# Patient Record
Sex: Male | Born: 1970 | Race: Black or African American | Hispanic: No | Marital: Single | Smoking: Former smoker
Health system: Southern US, Community
[De-identification: ages and names within clinical notes are randomized; demographics above are authoritative.]

## PROBLEM LIST (undated history)

## (undated) ENCOUNTER — Emergency Department (HOSPITAL_COMMUNITY): Admission: EM | Payer: Self-pay | Source: Home / Self Care

## (undated) HISTORY — PX: HERNIA REPAIR: SHX51

---

## 2001-05-14 ENCOUNTER — Ambulatory Visit (HOSPITAL_COMMUNITY): Admission: RE | Admit: 2001-05-14 | Discharge: 2001-05-14 | Payer: Self-pay | Admitting: Surgery

## 2001-05-26 ENCOUNTER — Inpatient Hospital Stay (HOSPITAL_COMMUNITY): Admission: AC | Admit: 2001-05-26 | Discharge: 2001-05-27 | Payer: Self-pay | Admitting: *Deleted

## 2001-06-12 ENCOUNTER — Encounter: Admission: RE | Admit: 2001-06-12 | Discharge: 2001-07-26 | Payer: Self-pay | Admitting: General Surgery

## 2001-07-25 ENCOUNTER — Emergency Department (HOSPITAL_COMMUNITY): Admission: EM | Admit: 2001-07-25 | Discharge: 2001-07-25 | Payer: Self-pay | Admitting: Emergency Medicine

## 2001-11-05 ENCOUNTER — Encounter: Admission: RE | Admit: 2001-11-05 | Discharge: 2001-11-05 | Payer: Self-pay | Admitting: Internal Medicine

## 2001-11-05 ENCOUNTER — Encounter: Payer: Self-pay | Admitting: Internal Medicine

## 2003-10-14 ENCOUNTER — Emergency Department (HOSPITAL_COMMUNITY): Admission: EM | Admit: 2003-10-14 | Discharge: 2003-10-14 | Payer: Self-pay | Admitting: Emergency Medicine

## 2005-07-18 ENCOUNTER — Encounter: Admission: RE | Admit: 2005-07-18 | Discharge: 2005-07-18 | Payer: Self-pay | Admitting: Internal Medicine

## 2006-05-11 ENCOUNTER — Ambulatory Visit (HOSPITAL_BASED_OUTPATIENT_CLINIC_OR_DEPARTMENT_OTHER): Admission: RE | Admit: 2006-05-11 | Discharge: 2006-05-11 | Payer: Self-pay | Admitting: Internal Medicine

## 2006-05-13 ENCOUNTER — Ambulatory Visit: Payer: Self-pay | Admitting: Internal Medicine

## 2008-11-05 ENCOUNTER — Encounter: Admission: RE | Admit: 2008-11-05 | Discharge: 2008-11-05 | Payer: Self-pay | Admitting: General Practice

## 2008-11-12 ENCOUNTER — Encounter: Admission: RE | Admit: 2008-11-12 | Discharge: 2008-12-15 | Payer: Self-pay | Admitting: General Practice

## 2010-06-18 ENCOUNTER — Encounter: Admission: RE | Admit: 2010-06-18 | Discharge: 2010-06-18 | Payer: Self-pay | Admitting: Occupational Medicine

## 2011-01-21 NOTE — Procedures (Signed)
NAME:  Geoffrey, Henderson                ACCOUNT NO.:  [id number]   MEDICAL RECORD NO.:  [id number]          PATIENT TYPE:  OUT   LOCATION:  SLEEP CENTER                 FACILITY:  [facility]   PHYSICIAN:  Clinton D. Maple Hudson, MD, FCCP, FACPDATE OF BIRTH:  1971/07/28   DATE OF STUDY:                              NOCTURNAL POLYSOMNOGRAM   REFERRING PHYSICIAN:  Fleet Contras, M.D.   DATE OF STUDY:  May 11, 2006.   INDICATIONS FOR PROCEDURE:  Hypersomnia with sleep apnea.   EPWORTH SLEEPINESS SCORE:  16/24, BMI 30.  Weight 192.   No medication. NPSG protocol requested.   SLEEP ARCHITECTURE:  Total sleep time 417 minutes with sleep efficiency 93%.  Stage I was 3%, stage II 75%, stages III and IV absent, REM 22% of total  sleep time.  Sleep latency 25 minutes, REM latency 43 minutes, awake after  sleep onset 7 minutes, arousal index 10.8.  No bedtime medication taken.   RESPIRATORY DATA:  Apnea/hypopnea index (AHI, RDI) 9.3 obstructive events  per hour indicating mild obstructive sleep apnea/hypopnea syndrome.  There  were 40 obstructive apneas and 25 hypopneas.  Most events occurred while  supine, although some were noted in all sleep positions.  REM AHI 15 per  hour.   OXYGEN DATA:  Moderate to severe snoring with oxygen desaturation to a nadir  of 80%.  Mean oxygen saturation through the study was 97% on room air.   CARDIAC DATA:  Sinus rhythm 48-50 beats per minute.   MOVEMENTS/PARASOMNIA:  Occasional limb jerk with arousal, 4 per hour which  is mildly elevated but of uncertain significance.  No bathroom trips.   IMPRESSION/RECOMMENDATIONS:  1. Mild obstructive sleep apnea/hypopnea syndrome, AHI 9.3 per hour with      most events while supine.  Moderate to loud snoring with oxygen      desaturation to a nadir of 80%.  2. Scores in this range are lower than usually treated with CPAP unless      symptomatic and other measures are unsuccessful.  It may be sufficient      for him to  sleep off the flat of his back and to treat nasal congestion      if      appropriate.  3. Mild periodic limb movement syndrome with arousal, 4 per hour.      Clinton D. Maple Hudson, MD, [facility], FACP  Diplomate, Biomedical engineer of Sleep Medicine  Electronically Signed     CDY/MEDQ  D:  05/13/2006 12:43:43  T:  05/14/2006 19:38:08  Job:  [id number]

## 2011-01-21 NOTE — Op Note (Signed)
[place]  Patient:    Geoffrey Henderson, CITRO Visit Number: [id number] MRN: [id number]          Service Type: DSU Location: DAY Attending Physician:  Andre Lefort Dictated by:   Sandria Bales. Ezzard Standing, M.D. Proc. Date: 05/14/01 Admit Date:  05/14/2001   CC:         Kern Reap, M.D.   Operative Report  DATE OF BIRTH:  1970-09-14  PREOPERATIVE DIAGNOSIS:  Umbilical hernia.  POSTOPERATIVE DIAGNOSIS:  Umbilical hernia.  OPERATION PERFORMED:  Umbilical herniorrhaphy.  SURGEON:  Sandria Bales. Ezzard Standing, M.D.  ASSISTANT:  None.  ANESTHESIA:  General LMA.  COMPLICATIONS:  None.  INDICATIONS FOR PROCEDURE:  The patient is a 40 year old black male from [place] with a symptomatic umbilical hernia and comes for repair of this hernia.  DESCRIPTION OF PROCEDURE:  The patient was placed in a supine position and given a general endotracheal anesthetic with LMA anesthesia.  His abdomen was prepped with Betadine solution and sterilely draped.  An infraumbilical smiling incision was made with sharp dissection finding a fascial defect underneath the umbilicus which was about 1.5 cm in diameter.  The hernia sac was dissected off the umbilical skin. The hernia sac was then resected.  The preperitoneal fat was tucked back under the fascia.  I then closed the umbilical hernia defect using five interrupted #1 Novofil sutures.  The wound was then irrigated and subcutaneous tissues closed with 3-0 Vicryl suture.  The skin was closed with a 3-0 Vicryl suture and the subcuticular stitch was a 5-0 Vicryl suture.  The wound was then painted with benzoin and steri-stripped and sterilely dressed.  The patient tolerated the procedure well and was transported to the recovery room in good condition.  The sponge and needle counts were correct at the end of the case. Dictated by:   Sandria Bales. Ezzard Standing, M.D. Attending Physician:  Andre Lefort DD:   05/14/01 TD:  05/14/01 Job: [id number]

## 2012-09-17 ENCOUNTER — Encounter (HOSPITAL_COMMUNITY): Payer: Self-pay | Admitting: *Deleted

## 2012-09-17 ENCOUNTER — Emergency Department (INDEPENDENT_AMBULATORY_CARE_PROVIDER_SITE_OTHER)
Admission: EM | Admit: 2012-09-17 | Discharge: 2012-09-17 | Disposition: A | Discharge status: Home / Self Care | Payer: Self-pay | Source: Home / Self Care | Attending: Family Medicine | Admitting: Family Medicine

## 2012-09-17 DIAGNOSIS — R109 Unspecified abdominal pain: Secondary | ICD-10-CM

## 2012-09-17 DIAGNOSIS — R51 Headache: Secondary | ICD-10-CM

## 2012-09-17 DIAGNOSIS — Z041 Encounter for examination and observation following transport accident: Secondary | ICD-10-CM

## 2012-09-17 DIAGNOSIS — M542 Cervicalgia: Secondary | ICD-10-CM

## 2012-09-17 MED ORDER — CYCLOBENZAPRINE HCL 5 MG PO TABS: 5.0000 mg | ORAL_TABLET | Freq: Three times a day (TID) | ORAL | Status: DC | PRN
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
## 2014-01-15 ENCOUNTER — Encounter (HOSPITAL_COMMUNITY): Payer: Self-pay | Admitting: Emergency Medicine

## 2014-01-15 ENCOUNTER — Emergency Department (HOSPITAL_COMMUNITY)
Admission: EM | Admit: 2014-01-15 | Discharge: 2014-01-15 | Disposition: A | Discharge status: Home / Self Care | Payer: BC Managed Care – PPO | Attending: Emergency Medicine | Admitting: Emergency Medicine

## 2014-01-15 DIAGNOSIS — W261XXA Contact with sword or dagger, initial encounter: Secondary | ICD-10-CM

## 2014-01-15 DIAGNOSIS — IMO0002 Reserved for concepts with insufficient information to code with codable children: Secondary | ICD-10-CM | POA: Insufficient documentation

## 2014-01-15 DIAGNOSIS — Z79899 Other long term (current) drug therapy: Secondary | ICD-10-CM | POA: Insufficient documentation

## 2014-01-15 DIAGNOSIS — Z23 Encounter for immunization: Secondary | ICD-10-CM | POA: Insufficient documentation

## 2014-01-15 DIAGNOSIS — Y93G9 Activity, other involving cooking and grilling: Secondary | ICD-10-CM | POA: Insufficient documentation

## 2014-01-15 DIAGNOSIS — S61209A Unspecified open wound of unspecified finger without damage to nail, initial encounter: Secondary | ICD-10-CM | POA: Insufficient documentation

## 2014-01-15 DIAGNOSIS — Z87891 Personal history of nicotine dependence: Secondary | ICD-10-CM | POA: Insufficient documentation

## 2014-01-15 DIAGNOSIS — Z7982 Long term (current) use of aspirin: Secondary | ICD-10-CM | POA: Insufficient documentation

## 2014-01-15 DIAGNOSIS — W260XXA Contact with knife, initial encounter: Secondary | ICD-10-CM | POA: Insufficient documentation

## 2014-01-15 DIAGNOSIS — Y9289 Other specified places as the place of occurrence of the external cause: Secondary | ICD-10-CM | POA: Insufficient documentation

## 2014-01-15 DIAGNOSIS — S61211A Laceration without foreign body of left index finger without damage to nail, initial encounter: Secondary | ICD-10-CM

## 2014-01-15 MED ORDER — TRAMADOL HCL 50 MG PO TABS: 50.0000 mg | ORAL_TABLET | Freq: Four times a day (QID) | ORAL | Status: DC | PRN

## 2014-01-15 MED ORDER — TETANUS-DIPHTH-ACELL PERTUSSIS 5-2.5-18.5 LF-MCG/0.5 IM SUSP
0.5000 mL | Freq: Once | INTRAMUSCULAR | Status: AC
  Administered 2014-01-15: 0.5 mL via INTRAMUSCULAR
  Filled 2014-01-15: qty 0.5

## 2014-01-15 NOTE — ED Notes (Signed)
Pt presents to department for evaluation of laceration to 2nd L finger. States he accidentally cut finger with knife last night, was seen at PCP today and told to come to ED for continued bleeding. Sensation and movement intact. Tetanus unknown.

## 2014-01-15 NOTE — Discharge Instructions (Signed)
Keep wound clean and covered. Follow up with your doctor as needed.

## 2014-01-15 NOTE — ED Provider Notes (Signed)
CSN: [id number]     Arrival date & time 01/15/14  1205 History  This chart was scribed for non-physician practitioner working with Rolland PorterMark James, MD, by Jarvis Morganaylor Ferguson, ED Scribe. This patient was seen in room TR11C/TR11C and the patient's care was started at 12:30 PM.     Chief Complaint  Patient presents with  . Laceration     HPI HPI Comments: Geoffrey Henderson is a 43 y.o. male who presents to the Emergency Department complaining of a laceration to 2nd left finger. Patient states that he accidentally cut his finger with a knife last night when he was cutting chicken. Patient states that he went to see his PCP today and that they sent him to the ED because the bleeding could not be controlled. Bleeding is attempted to be controlled with gauze. Patient states that he is able to move his finger. He states he is unsure of when his last T-dap vaccination was. Patient denies any numbness in finger.   History reviewed. No pertinent past medical history. Past Surgical History  Procedure Laterality Date  . Hernia repair     History reviewed. No pertinent family history. History  Substance Use Topics  . Smoking status: Former Smoker    Quit date: 06/06/2011  . Smokeless tobacco: Not on file  . Alcohol Use: Yes     Comment: occasional    Review of Systems  Skin: Positive for wound (laceration to 2nd finger on left hand).  Neurological: Negative for numbness (left 2nd finger).      Allergies  Review of patient's allergies indicates no known allergies.  Home Medications   Prior to Admission medications   Medication Sig Start Date End Date Taking? Authorizing Provider  aspirin EC 81 MG tablet Take 81 mg by mouth daily.   Yes Historical Provider, MD  fexofenadine (ALLEGRA) 180 MG tablet Take 180 mg by mouth daily.   Yes Historical Provider, MD  fluticasone (FLONASE) 50 MCG/ACT nasal spray Place 2 sprays into both nostrils daily as needed. For allergies 12/29/13  Yes Historical Provider,  MD  Multiple Vitamin (MULTIVITAMIN WITH MINERALS) TABS tablet Take 1 tablet by mouth daily.   Yes Historical Provider, MD  NEXIUM 40 MG capsule Take 1 capsule by mouth daily. 12/29/13  Yes Historical Provider, MD   Triage Vitals: BP 118/70  Pulse 104  Temp(Src) 98.2 F (36.8 C) (Oral)  Resp 18  SpO2 100% Physical Exam  Nursing note and vitals reviewed. Constitutional: He is oriented to person, place, and time. He appears well-developed and well-nourished. No distress.  HENT:  Head: Normocephalic and atraumatic.  Eyes: Conjunctivae are normal.  Neck: Normal range of motion.  Cardiovascular: Normal rate and regular rhythm.  Exam reveals no gallop and no friction rub.   No murmur heard. Pulmonary/Chest: Effort normal and breath sounds normal. He has no wheezes. He has no rales. He exhibits no tenderness.  Musculoskeletal: Normal range of motion.  Lymphadenopathy:    He has no cervical adenopathy.  Neurological: He is alert and oriented to person, place, and time. Coordination normal.  Left distal index finger sensation intact. Speech is goal-oriented. Moves limbs without ataxia.   Skin: Skin is warm and dry.  Laceration to distal left index finger that is continuing to ooze blood. The wound is open and does not have approximated edges.   Psychiatric: He has a normal mood and affect. His behavior is normal.    ED Course  NERVE BLOCK Date/Time: 01/15/2014 1:55 PM Performed by:  Isaih Bulger Authorized by: Emilia BeckSZEKALSKI, Dearia [place] Consent: Verbal consent obtained. Consent given by: patient Patient understanding: patient states understanding of the procedure being performed Patient consent: the patient's understanding of the procedure matches consent given Patient identity confirmed: verbally with patient Indications: pain relief Body area: upper extremity Nerve: digital Laterality: left Patient sedated: no Patient position: sitting Needle gauge: 22 G Location technique:  anatomical landmarks Local anesthetic: lidocaine 2% without epinephrine Anesthetic total: 5 ml Outcome: pain improved Patient tolerance: Patient tolerated the procedure well with no immediate complications.   (including critical care time)  LACERATION REPAIR Performed by: Emilia BeckKaitlyn Zelta Enfield Authorized by: Emilia BeckKaitlyn Constantino Starace Consent: Verbal consent obtained. Risks and benefits: risks, benefits and alternatives were discussed Consent given by: patient Patient identity confirmed: provided demographic data Prepped and Draped in normal sterile fashion Wound explored  Laceration Location: left distal index finger  Laceration Length: 1 cm  No Foreign Bodies seen or palpated  Anesthesia: digital block  Local anesthetic: lidocaine 2% without epinephrine  Anesthetic total: 5 ml  Irrigation method: syringe Amount of cleaning: standard  Skin closure: surgicel   Number of sutures: n/a  Technique: n/a  Patient tolerance: Patient tolerated the procedure well with no immediate complications.   DIAGNOSTIC STUDIES: Oxygen Saturation is 100% on RA, normal by my interpretation.    COORDINATION OF CARE:    Labs Review Labs Reviewed - No data to display  Imaging Review No results found.   EKG Interpretation None      MDM   Final diagnoses:  Laceration of left index finger w/o foreign body w/o damage to nail    3:47 PM Patient's wound cleaned and repaired with surgicel with overlying gauze and coban. Patient instructed to keep wound dressed until it heals. Patient given tdap.   I personally performed the services described in this documentation, which was scribed in my presence. The recorded information has been reviewed and is accurate.     Emilia BeckKaitlyn Errica Dutil, PA-C 01/15/14 1550

## 2014-01-15 NOTE — ED Notes (Signed)
Bandage applied to left pointer finger by PA

## 2014-01-20 NOTE — ED Provider Notes (Signed)
Medical screening examination/treatment/procedure(s) were performed by non-physician practitioner and as supervising physician I was immediately available for consultation/collaboration.   EKG Interpretation None        Alayne Estrella, MD 01/20/14 2138 

## 2014-11-13 ENCOUNTER — Ambulatory Visit (INDEPENDENT_AMBULATORY_CARE_PROVIDER_SITE_OTHER): Payer: BLUE CROSS/BLUE SHIELD | Admitting: Podiatry

## 2014-11-13 ENCOUNTER — Encounter: Payer: Self-pay | Admitting: Podiatry

## 2014-11-13 VITALS — BP 123/44 | HR 71 | Resp 16

## 2014-11-13 DIAGNOSIS — L600 Ingrowing nail: Secondary | ICD-10-CM

## 2014-11-13 NOTE — Patient Instructions (Signed)

## 2014-11-13 NOTE — Progress Notes (Signed)
   Subjective:    Patient ID: Geoffrey Henderson, male    DOB: 07/03/1971, 44 y.o.   MRN: [id number]  HPI  Pt presents with right 4th ingrown nail lateral border and left great ingrown nail medial border, both painful and red  Review of Systems  Allergic/Immunologic: Positive for environmental allergies.  All other systems reviewed and are negative.      Objective:   Physical Exam        Assessment & Plan:

## 2014-11-14 NOTE — Progress Notes (Signed)
Subjective:     Patient ID: Geoffrey Henderson, male   DOB: 02/01/1971, 44 y.o.   MRN: [id number]  HPI patient presents with 2 ingrown toenails right fourth and left big toe stating that they're tender and they get red and it's hard for her to cut them   Review of Systems  All other systems reviewed and are negative.      Objective:   Physical Exam  Constitutional: He is oriented to person, place, and time.  Cardiovascular: Intact distal pulses.   Musculoskeletal: Normal range of motion.  Neurological: He is oriented to person, place, and time.  Skin: Skin is warm. No erythema.  Nursing note and vitals reviewed.  neurovascular status is found to be intact with muscle strength adequate and range of motion within normal limits. Right fourth toe medial border is tender when pressed and left big toe medial border is incurvated and sore when pressed. She's tried trimming and soak them without relief of symptoms and they're becoming increasingly difficult to wear shoe gear with     Assessment:     Ingrown toenail deformity right fourth toe left hallux with pain    Plan:     H&P and condition discussed. I've recommended removal of the corners and I explained procedure and risk area patient wants surgery and today I infiltrated each toe with 60 mg Xylocaine Marcaine mixture remove the border of the right fourth toe and the left hallux exposed matrix and applied phenol 3 applications 30 seconds followed by alcohol lavage and sterile dressing. Gave instructions on soaks and reappoint

## 2014-11-17 ENCOUNTER — Encounter: Payer: Self-pay | Admitting: Podiatry

## 2014-11-17 ENCOUNTER — Ambulatory Visit (INDEPENDENT_AMBULATORY_CARE_PROVIDER_SITE_OTHER): Payer: BLUE CROSS/BLUE SHIELD | Admitting: Podiatry

## 2014-11-17 ENCOUNTER — Ambulatory Visit: Payer: BLUE CROSS/BLUE SHIELD | Admitting: Podiatry

## 2014-11-17 VITALS — BP 111/65 | HR 69 | Temp 98.6°F

## 2014-11-17 DIAGNOSIS — L600 Ingrowing nail: Secondary | ICD-10-CM

## 2014-11-17 DIAGNOSIS — IMO0002 Reserved for concepts with insufficient information to code with codable children: Secondary | ICD-10-CM

## 2014-11-17 DIAGNOSIS — L03039 Cellulitis of unspecified toe: Secondary | ICD-10-CM

## 2014-11-17 MED ORDER — HYDROCODONE-ACETAMINOPHEN 10-325 MG PO TABS: 1.0000 | ORAL_TABLET | Freq: Three times a day (TID) | ORAL | Status: DC | PRN

## 2014-11-17 MED ORDER — CEPHALEXIN 500 MG PO CAPS: 500.0000 mg | ORAL_CAPSULE | Freq: Two times a day (BID) | ORAL | Status: DC

## 2014-11-17 NOTE — Progress Notes (Signed)
   Subjective:    Patient ID: Geoffrey Henderson, male    DOB: 10/19/1970, 44 y.o.   MRN: [id number]  HPI  f/u on Left great toe and right 4th toe ingrown nail procedures.  Patient complaining of swelling and pain.  Said he took tylenol, but that didn't work so isn't taking anything.  Review of Systems  All other systems reviewed and are negative.      Objective:   Physical Exam        Assessment & Plan:

## 2014-11-18 NOTE — Progress Notes (Signed)
Subjective:     Patient ID: Geoffrey Henderson, male   DOB: 03/02/1971, 44 y.o.   MRN: [id number]  HPI patient states I'm still having some pain and more my ingrown and were fixed and I wanted to make sure everything was okay   Review of Systems     Objective:   Physical Exam Neurovascular status intact no change in health history with draining left hallux medial border and fourth toe right foot medial border. I did not note proximal edema erythema or drainage noted    Assessment:     Possible mild paronychia infection of healing nail bed    Plan:     As precautionary measure I did place on cephalexin 500 mg twice a day and also Vicodin 06/07/2024 for pain. I instructed if any issues were to occur patient's to let us know immediately if not this should go on to uneventful healing

## 2014-11-20 ENCOUNTER — Encounter: Payer: Self-pay | Admitting: Podiatry

## 2014-11-25 ENCOUNTER — Ambulatory Visit (INDEPENDENT_AMBULATORY_CARE_PROVIDER_SITE_OTHER): Payer: BLUE CROSS/BLUE SHIELD

## 2014-11-25 ENCOUNTER — Encounter: Payer: Self-pay | Admitting: Podiatry

## 2014-11-25 ENCOUNTER — Ambulatory Visit (INDEPENDENT_AMBULATORY_CARE_PROVIDER_SITE_OTHER): Payer: BLUE CROSS/BLUE SHIELD | Admitting: Podiatry

## 2014-11-25 VITALS — BP 116/63 | HR 84 | Resp 18

## 2014-11-25 DIAGNOSIS — R52 Pain, unspecified: Secondary | ICD-10-CM

## 2014-11-25 DIAGNOSIS — L03019 Cellulitis of unspecified finger: Secondary | ICD-10-CM

## 2014-11-25 DIAGNOSIS — IMO0002 Reserved for concepts with insufficient information to code with codable children: Secondary | ICD-10-CM

## 2014-11-26 NOTE — Progress Notes (Signed)
Subjective:     Patient ID: Geoffrey Henderson, male   DOB: 11/17/1970, 44 y.o.   MRN: [id number]  HPI patient concerns his right fourth toe that he states is discolored and had an ingrown toenail remove several weeks ago. He is concerned that there could be a problem with circulation   Review of Systems     Objective:   Physical Exam I checked circulatory status thoroughly and found pulses to be excellent and all digits to be perfused well with all toes warm on the right foot. I noted that the fourth toe right does have some slight dusky disc coloration but there is minimal pain associated with it and the ingrown toenail remove is healing well with no drainage noted currently    Assessment:     Possible stress to the right fourth toe but no indication of acute gangrenous process that's going on due to no discomfort and excellent circulatory status with a warm toe noted    Plan:     I instructed on warm water soaks and reviewed an x-ray of this foot and explained if it should change anyway he is to reappoint immediately if not I want to recheck again in 3-4 weeks and it should gradually return to normal color but may take time.

## 2014-12-10 ENCOUNTER — Telehealth: Payer: Self-pay | Admitting: *Deleted

## 2014-12-10 NOTE — Telephone Encounter (Signed)
Pt states the left big toe did good after the surgical procedure, the right toe next to the pinkie toe is still painful and dripping.  Pt requested an appt with Dr. Charlsie Merlesegal.  I encouraged pt to continue the soaks and transferred to schedulers.

## 2014-12-11 ENCOUNTER — Ambulatory Visit (INDEPENDENT_AMBULATORY_CARE_PROVIDER_SITE_OTHER): Payer: BLUE CROSS/BLUE SHIELD | Admitting: Podiatry

## 2014-12-11 ENCOUNTER — Encounter: Payer: Self-pay | Admitting: Podiatry

## 2014-12-11 VITALS — BP 115/78 | HR 75 | Resp 15

## 2014-12-11 DIAGNOSIS — R52 Pain, unspecified: Secondary | ICD-10-CM

## 2014-12-11 DIAGNOSIS — IMO0002 Reserved for concepts with insufficient information to code with codable children: Secondary | ICD-10-CM

## 2014-12-11 DIAGNOSIS — L03019 Cellulitis of unspecified finger: Secondary | ICD-10-CM

## 2014-12-14 NOTE — Progress Notes (Signed)
Subjective:     Patient ID: Geoffrey Henderson, male   DOB: 05/28/1971, 44 y.o.   MRN: [id number]  HPI patient states I want you to check my fourth toe again. It is not hurting but I was concerned about the color of it   Review of Systems     Objective:   Physical Exam Neurovascular status was found to be intact with digits well perfused and I noted warmth to the fourth toe right. There is a sloughing of the skin layer with underlying healthy tissue and a small amount of distal irritation to the toe that's localized with no drainage no odor or no proximal erythema edema noted. There is no discomfort when I palpated the toe    Assessment:     Probable mild vascular trauma to the right fourth toe which appears to be healing but we'll have to go through a period of discoloration before normal tissue will gradually recur. No indications of necrosis currently or any long-term pathology    Plan:     I reviewed the color of the toe with the patient and advised on continued soaks and monitoring of this. I do think it will heal uneventfully did explain there is a possibility for microvascular disease and there is a possibility for sloughing of tissue to occur. Do not see current indications of gangrene but we will have to continue to monitor and I will see back again in 2 weeks or earlier if any adverse changes or pain should occur

## 2014-12-16 ENCOUNTER — Ambulatory Visit: Payer: BLUE CROSS/BLUE SHIELD | Admitting: Podiatry

## 2017-08-06 ENCOUNTER — Emergency Department (HOSPITAL_COMMUNITY): Payer: Managed Care, Other (non HMO)

## 2017-08-06 ENCOUNTER — Emergency Department (HOSPITAL_COMMUNITY)
Admission: EM | Admit: 2017-08-06 | Discharge: 2017-08-06 | Disposition: A | Discharge status: Home / Self Care | Payer: Managed Care, Other (non HMO) | Attending: Emergency Medicine | Admitting: Emergency Medicine

## 2017-08-06 ENCOUNTER — Encounter (HOSPITAL_COMMUNITY): Payer: Self-pay | Admitting: *Deleted

## 2017-08-06 DIAGNOSIS — S161XXA Strain of muscle, fascia and tendon at neck level, initial encounter: Secondary | ICD-10-CM | POA: Diagnosis not present

## 2017-08-06 DIAGNOSIS — M62830 Muscle spasm of back: Secondary | ICD-10-CM | POA: Diagnosis not present

## 2017-08-06 DIAGNOSIS — Z79899 Other long term (current) drug therapy: Secondary | ICD-10-CM | POA: Insufficient documentation

## 2017-08-06 DIAGNOSIS — Y999 Unspecified external cause status: Secondary | ICD-10-CM | POA: Insufficient documentation

## 2017-08-06 DIAGNOSIS — Z87891 Personal history of nicotine dependence: Secondary | ICD-10-CM | POA: Insufficient documentation

## 2017-08-06 DIAGNOSIS — Z7982 Long term (current) use of aspirin: Secondary | ICD-10-CM | POA: Diagnosis not present

## 2017-08-06 DIAGNOSIS — Y939 Activity, unspecified: Secondary | ICD-10-CM | POA: Diagnosis not present

## 2017-08-06 DIAGNOSIS — S199XXA Unspecified injury of neck, initial encounter: Secondary | ICD-10-CM | POA: Diagnosis present

## 2017-08-06 DIAGNOSIS — Y92410 Unspecified street and highway as the place of occurrence of the external cause: Secondary | ICD-10-CM | POA: Diagnosis not present

## 2017-08-06 MED ORDER — KETOROLAC TROMETHAMINE 60 MG/2ML IM SOLN
30.0000 mg | Freq: Once | INTRAMUSCULAR | Status: AC
  Administered 2017-08-06: 30 mg via INTRAMUSCULAR
  Filled 2017-08-06: qty 2

## 2017-08-06 MED ORDER — NAPROXEN 500 MG PO TABS: 500.0000 mg | ORAL_TABLET | Freq: Two times a day (BID) | ORAL | 0 refills | Status: DC

## 2017-08-06 MED ORDER — CYCLOBENZAPRINE HCL 10 MG PO TABS: 10.0000 mg | ORAL_TABLET | Freq: Two times a day (BID) | ORAL | 0 refills | Status: DC | PRN
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
## 2017-08-06 NOTE — Discharge Instructions (Signed)
Take the medication as directed. Do not take the muscle relaxer if driving as it will make you sleepy. Follow up with your doctor later in the week for recheck or return here sooner for worsening symptoms.
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
## 2017-11-01 ENCOUNTER — Encounter: Payer: Self-pay | Admitting: Physical Therapy

## 2017-11-01 ENCOUNTER — Other Ambulatory Visit: Payer: Self-pay

## 2017-11-01 ENCOUNTER — Ambulatory Visit: Payer: Managed Care, Other (non HMO) | Attending: Internal Medicine | Admitting: Physical Therapy

## 2017-11-01 DIAGNOSIS — M25511 Pain in right shoulder: Secondary | ICD-10-CM | POA: Diagnosis present

## 2017-11-01 DIAGNOSIS — M542 Cervicalgia: Secondary | ICD-10-CM | POA: Diagnosis present

## 2017-11-01 DIAGNOSIS — R252 Cramp and spasm: Secondary | ICD-10-CM | POA: Insufficient documentation
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
## 2017-11-07 ENCOUNTER — Ambulatory Visit: Payer: Managed Care, Other (non HMO) | Attending: Internal Medicine | Admitting: Physical Therapy

## 2017-11-07 ENCOUNTER — Encounter: Payer: Self-pay | Admitting: Physical Therapy

## 2017-11-07 DIAGNOSIS — M542 Cervicalgia: Secondary | ICD-10-CM | POA: Diagnosis not present

## 2017-11-07 DIAGNOSIS — M25511 Pain in right shoulder: Secondary | ICD-10-CM

## 2017-11-07 DIAGNOSIS — R252 Cramp and spasm: Secondary | ICD-10-CM | POA: Insufficient documentation

## 2017-11-07 NOTE — Therapy (Signed)
[place], [place], [address] Phone: [phone]   Fax:  [phone]  Physical Therapy Treatment  Patient Details  Name: Geoffrey Henderson MRN: [id number] Date of Birth: December 15, 1970 Referring Provider: Bennett Scrape   Encounter Date: 11/07/2017  PT End of Session - 11/07/17 1644    Visit Number  2    Date for PT Re-Evaluation  12/30/17    PT Start Time  5809    PT Stop Time  1700    PT Time Calculation (min)  55 min    Activity Tolerance  Patient tolerated treatment well    Behavior During Therapy  [facility] for tasks assessed/performed       History reviewed. No pertinent past medical history.  Past Surgical History:  Procedure Laterality Date  . HERNIA REPAIR      There were no vitals filed for this visit.  Subjective Assessment - 11/07/17 1622    Subjective  Reports stretches felt good, he reports that he has been working a lot today, and that he is hurting right now    Currently in Pain?  Yes    Pain Score  8     Pain Location  Neck    Pain Orientation  Right    Aggravating Factors   work                      [facility] Adult PT Treatment/Exercise - 11/07/17 0001      Exercises   Exercises  Neck      Neck Exercises: Machines for Strengthening   Nustep  level 5 x 6 minutes    Cybex Row  20# 2x10    Lat Pull  20# 2x10      Neck Exercises: Standing   Wall Push Ups  20 reps    Other Standing Exercises  door stretch, stargazer stretches      Neck Exercises: Supine   Cervical Isometrics  Flexion;Extension;Right lateral flexion;Left lateral flexion;Right rotation;Left rotation;3 secs;10 reps    Neck Retraction  20 reps;3 secs      Modalities   Modalities  Moist Heat;Electrical Stimulation      Moist Heat Therapy   Number Minutes Moist Heat  15 Minutes    Moist Heat Location  Shoulder;Cervical      Electrical Stimulation   Electrical Stimulation Location  neck and right  shoulder    Electrical Stimulation Action  IFC    Electrical Stimulation Parameters  supine    Electrical Stimulation Goals  Pain               PT Short Term Goals - 11/07/17 1646      PT SHORT TERM GOAL #1   Title  independent with intial HEP    Status  Partially Met        PT Long Term Goals - 11/01/17 1741      PT LONG TERM GOAL #1   Title  decrease pain 50% for ADLs and to be able to work    Time  8    Period  Weeks    Status  New      PT LONG TERM GOAL #2   Title  reports being able to work with no pain or limitation    Time  8    Period  Weeks    Status  New      PT LONG TERM GOAL #3   Title  reports dressing with no pain or limitation    Time  8    Period  Weeks    Status  New      PT LONG TERM GOAL #4   Title  improve shoulder MMT (flex and abd) to 4+/5 without pain    Time  8    Period  Weeks    Status  New      PT LONG TERM GOAL #5   Title  verbalizes and demonstrates good posture and body mechanics and importance of them    Time  8    Period  Weeks    Status  New            Plan - 11/07/17 1644    Clinical Impression Statement  Patient reported no worse pain with the exercises.  He has significant spasms in the upper traps.  He is tight in the anterior shoulder and chest.  Needed a lot of cues for proper form    PT Next Visit Plan  begin scap stabilization ex, STM     Consulted and Agree with Plan of Care  Patient       Patient will benefit from skilled therapeutic intervention in order to improve the following deficits and impairments:  Pain, Increased muscle spasms, Decreased endurance, Decreased activity tolerance, Decreased strength, Impaired UE functional use, Impaired flexibility  Visit Diagnosis: Cervicalgia  Acute pain of right shoulder  Cramp and spasm     Problem List There are no active problems to display for this patient.   Sumner Boast., PT 11/07/2017, 4:47 PM  [place], [place], [address] Phone: [phone]   Fax:  [phone]  Name: Geoffrey Henderson MRN: [id number] Date of Birth: 1970/09/27

## 2017-11-09 ENCOUNTER — Ambulatory Visit: Payer: Managed Care, Other (non HMO) | Admitting: Physical Therapy

## 2017-11-09 ENCOUNTER — Encounter: Payer: Self-pay | Admitting: Physical Therapy

## 2017-11-09 DIAGNOSIS — M542 Cervicalgia: Secondary | ICD-10-CM | POA: Diagnosis not present

## 2017-11-09 DIAGNOSIS — R252 Cramp and spasm: Secondary | ICD-10-CM

## 2017-11-09 DIAGNOSIS — M25511 Pain in right shoulder: Secondary | ICD-10-CM

## 2017-11-09 NOTE — Therapy (Signed)
[place], [place], [address] Phone: [phone]   Fax:  [phone]  Physical Therapy Treatment  Patient Details  Name: Geoffrey Henderson MRN: [id number] Date of Birth: [birth year] Referring Provider: Bennett Scrape   Encounter Date: 11/09/2017  PT End of Session - 11/09/17 1650    Visit Number  3    Date for PT Re-Evaluation  12/30/17    PT Start Time  7948    PT Stop Time  1704    PT Time Calculation (min)  49 min    Activity Tolerance  Patient tolerated treatment well    Behavior During Therapy  [facility] for tasks assessed/performed       History reviewed. No pertinent past medical history.  Past Surgical History:  Procedure Laterality Date  . HERNIA REPAIR      There were no vitals filed for this visit.  Subjective Assessment - 11/09/17 1618    Subjective  Reports that he tried some of the exercises that we did the other day and he was really sore.  Reports that he is having some back pain as well    Currently in Pain?  Yes    Pain Score  7     Pain Location  Shoulder    Pain Orientation  Right    Pain Descriptors / Indicators  Tightness;Sore    Aggravating Factors   work                      [employer] Adult PT Treatment/Exercise - 11/09/17 0001      Neck Exercises: Machines for Strengthening   UBE (Upper Arm Bike)  constant work 20 watts x 5 minutes    Nustep  level 5 x 6 minutes    Cybex Row  20# 2x10    Lat Pull  20# 2x10      Neck Exercises: Standing   Wall Push Ups  20 reps    Other Standing Exercises  door stretch, stargazer stretches      Moist Heat Therapy   Number Minutes Moist Heat  15 Minutes    Moist Heat Location  Shoulder;Cervical      Electrical Stimulation   Electrical Stimulation Location  neck and right shoulder    Electrical Stimulation Action  IFC    Electrical Stimulation Parameters  supine    Electrical Stimulation Goals  Pain       Trigger  Point Dry Needling - 11/09/17 1649    Consent Given?  Yes    Education Handout Provided  Yes    Muscles Treated Upper Body  Upper trapezius    Upper Trapezius Response  Twitch reponse elicited             PT Short Term Goals - 11/07/17 1646      PT SHORT TERM GOAL #1   Title  independent with intial HEP    Status  Partially Met        PT Long Term Goals - 11/09/17 1657      PT LONG TERM GOAL #1   Title  decrease pain 50% for ADLs and to be able to work    Status  On-going      PT LONG TERM GOAL #2   Title  reports being able to work with no pain or limitation    Status  On-going            Plan - 11/09/17  1652    Clinical Impression Statement  Patient had a good response with the dry needling.  He does fatigue with activities and does report some increased pain with that.  He has a lot of spasms and trigger points in the right upper trap    PT Next Visit Plan  see how the dry needling did, continue with exercises    Consulted and Agree with Plan of Care  Patient       Patient will benefit from skilled therapeutic intervention in order to improve the following deficits and impairments:  Pain, Increased muscle spasms, Decreased endurance, Decreased activity tolerance, Decreased strength, Impaired UE functional use, Impaired flexibility  Visit Diagnosis: Cervicalgia  Acute pain of right shoulder  Cramp and spasm     Problem List There are no active problems to display for this patient.   Sumner Boast., PT 11/09/2017, 4:58 PM  [place], [place], [address] Phone: [phone]   Fax:  [phone]  Name: Geoffrey Henderson MRN: [id number] Date of Birth: 1971/04/17

## 2017-11-13 ENCOUNTER — Ambulatory Visit: Payer: Managed Care, Other (non HMO) | Admitting: Physical Therapy

## 2017-11-16 ENCOUNTER — Ambulatory Visit: Payer: Managed Care, Other (non HMO) | Admitting: Physical Therapy

## 2017-11-16 ENCOUNTER — Encounter: Payer: Self-pay | Admitting: Physical Therapy

## 2017-11-16 DIAGNOSIS — M542 Cervicalgia: Secondary | ICD-10-CM

## 2017-11-16 DIAGNOSIS — M25511 Pain in right shoulder: Secondary | ICD-10-CM

## 2017-11-16 DIAGNOSIS — R252 Cramp and spasm: Secondary | ICD-10-CM

## 2017-11-16 NOTE — Therapy (Signed)
[place], [place], [address] Phone: [phone]   Fax:  [phone]  Physical Therapy Treatment  Patient Details  Name: Geoffrey Henderson MRN: [id number] Date of Birth: [birth year] Referring Provider: Bennett Scrape   Encounter Date: 11/16/2017  PT End of Session - 11/16/17 1345    Visit Number  4    Date for PT Re-Evaluation  12/30/17    PT Start Time  1300    PT Stop Time  1359    PT Time Calculation (min)  59 min    Activity Tolerance  Patient tolerated treatment well    Behavior During Therapy  [facility] for tasks assessed/performed       History reviewed. No pertinent past medical history.  Past Surgical History:  Procedure Laterality Date  . HERNIA REPAIR      There were no vitals filed for this visit.  Subjective Assessment - 11/16/17 1307    Subjective  Pt reports soreness, stated that pain start in R shoulder and goes down back    Currently in Pain?  Yes    Pain Score  8     Pain Location  Shoulder    Pain Orientation  Right                      [facility] Adult PT Treatment/Exercise - 11/16/17 0001      Neck Exercises: Machines for Strengthening   UBE (Upper Arm Bike)  constant work 20 watts x 4 minutes    Nustep  level 5 x 6 minutes    Cybex Row  20# 2x10    Lat Pull  20# 2x10      Neck Exercises: Standing   Wall Push Ups  20 reps    Other Standing Exercises  door stretch, stargazer stretches      Moist Heat Therapy   Number Minutes Moist Heat  15 Minutes    Moist Heat Location  Shoulder;Cervical      Electrical Stimulation   Electrical Stimulation Location  neck and right shoulder    Electrical Stimulation Action  IFC    Electrical Stimulation Parameters  supine      Manual Therapy   Manual Therapy  Neural Stretch;Passive ROM    Passive ROM  R shoulder     Neural Stretch  R median nerve               PT Short Term Goals - 11/07/17 1646      PT  SHORT TERM GOAL #1   Title  independent with intial HEP    Status  Partially Met        PT Long Term Goals - 11/09/17 1657      PT LONG TERM GOAL #1   Title  decrease pain 50% for ADLs and to be able to work    Status  On-going      PT LONG TERM GOAL #2   Title  reports being able to work with no pain or limitation    Status  On-going            Plan - 11/16/17 1345    Clinical Impression Statement  positive response to nural stretch, Pt does reports some pain at the end range of passive shoulder flexion. Reports difficulty at time using a screw driver. Postural and pacing cues with seated rows.     Rehab Potential  Good    PT Frequency  2x / week    PT Duration  8 weeks    PT Treatment/Interventions  Electrical Stimulation;Moist Heat;Therapeutic exercise;Therapeutic activities;Patient/family education;Passive range of motion;Manual techniques    PT Next Visit Plan  see how the dry needling did, continue with exercises       Patient will benefit from skilled therapeutic intervention in order to improve the following deficits and impairments:  Pain, Increased muscle spasms, Decreased endurance, Decreased activity tolerance, Decreased strength, Impaired UE functional use, Impaired flexibility  Visit Diagnosis: Acute pain of right shoulder  Cervicalgia  Cramp and spasm     Problem List There are no active problems to display for this patient.   Scot Jun, PTA 11/16/2017, 1:48 PM  [place], [place], [address] Phone: [phone]   Fax:  [phone]  Name: GWIN EAGON MRN: [id number] Date of Birth: 28-Jan-1971

## 2017-11-20 ENCOUNTER — Ambulatory Visit: Payer: Managed Care, Other (non HMO) | Admitting: Physical Therapy

## 2017-11-20 DIAGNOSIS — M542 Cervicalgia: Secondary | ICD-10-CM | POA: Diagnosis not present

## 2017-11-20 DIAGNOSIS — R252 Cramp and spasm: Secondary | ICD-10-CM

## 2017-11-20 DIAGNOSIS — M25511 Pain in right shoulder: Secondary | ICD-10-CM

## 2017-11-20 NOTE — Therapy (Signed)
[place], [place], [address] Phone: [phone]   Fax:  [phone]  Physical Therapy Treatment  Patient Details  Name: Geoffrey Henderson MRN: [id number] Date of Birth: 08-07-1971 Referring Provider: Bennett Scrape   Encounter Date: 11/20/2017  PT End of Session - 11/20/17 1716    Visit Number  5    Date for PT Re-Evaluation  12/30/17    PT Start Time  1708    PT Stop Time  1807    PT Time Calculation (min)  59 min    Activity Tolerance  Patient tolerated treatment well    Behavior During Therapy  [facility] for tasks assessed/performed       No past medical history on file.  Past Surgical History:  Procedure Laterality Date  . HERNIA REPAIR      There were no vitals filed for this visit.  Subjective Assessment - 11/20/17 1713    Subjective  Pt. reporting some R-sided neck pain earlier today at work however unsure of task being performed with this.      Currently in Pain?  Yes    Pain Score  7     Pain Location  Shoulder    Pain Orientation  Right    Pain Descriptors / Indicators  Sharp    Pain Type  Acute pain    Pain Radiating Towards  from neck to R shoulder into LB and R hip     Pain Onset  More than a month ago    Pain Frequency  Constant    Aggravating Factors   work     Multiple Pain Sites  No                      [facility] Adult PT Treatment/Exercise - 11/20/17 1722      Neck Exercises: Machines for Strengthening   UBE (Upper Arm Bike)  Lvl 3.0, 3 min each way    Cybex Row  B UE 20# x 15 reps     Lat Pull  20# x 15 reps  Cues for scap squeeze/depression       Neck Exercises: Theraband   Shoulder External Rotation  10 reps;Other (comment) yellow TB    Shoulder External Rotation Limitations  Hooklying on 1/2 foam bolster     Horizontal ABduction  10 reps;Green    Horizontal ABduction Limitations  Hooklying on 1/2 foam noodle       Neck Exercises: Prone   Shoulder  Extension  10 reps;Weights    Shoulder Extension Weights (lbs)  1#    Shoulder Extension Limitations  prone "I's" on p-ball     Other Prone Exercise  Prone knees and elbows cervical diagonals x 10 reps each way       Moist Heat Therapy   Number Minutes Moist Heat  15 Minutes    Moist Heat Location  Shoulder;Cervical      Electrical Stimulation   Electrical Stimulation Location  neck and right shoulder    Electrical Stimulation Action  IFC, 15'    Electrical Stimulation Parameters  supine     Electrical Stimulation Goals  Pain      Neck Exercises: Land Limitations  3-way prayer stretch for lumbar spine x 30 sec each way     Chest Stretch  1 rep;60 seconds Hooklying on 1/2 foam bolster  PT Short Term Goals - 11/07/17 1646      PT SHORT TERM GOAL #1   Title  independent with intial HEP    Status  Partially Met        PT Long Term Goals - 11/09/17 1657      PT LONG TERM GOAL #1   Title  decrease pain 50% for ADLs and to be able to work    Status  On-going      PT LONG TERM GOAL #2   Title  reports being able to work with no pain or limitation    Status  On-going            Plan - 11/20/17 1720    Clinical Impression Statement  Geoffrey Henderson doing well today.  Tolerated progression of scapular strengthening well today.  Some cueing required today to avoid pushing into painful ranges with scapular strengthening activities.  Ended with E-stim/moist heat to cervical and R shoulder per pt. request to decrease post-exercise pain and tone.  Will continues to progress toward goals.      PT Treatment/Interventions  Electrical Stimulation;Moist Heat;Therapeutic exercise;Therapeutic activities;Patient/family education;Passive range of motion;Manual techniques    PT Next Visit Plan  see how the dry needling did, continue with exercises    Consulted and Agree with Plan of Care  Patient       Patient will benefit from skilled therapeutic  intervention in order to improve the following deficits and impairments:  Pain, Increased muscle spasms, Decreased endurance, Decreased activity tolerance, Decreased strength, Impaired UE functional use, Impaired flexibility  Visit Diagnosis: Acute pain of right shoulder  Cervicalgia  Cramp and spasm     Problem List There are no active problems to display for this patient.   Micah Denny, PTA 11/20/17 6:09 PM  [facility] [address] [place], [place], [address] Phone: [phone]   Fax:  [phone]  Name: Geoffrey Henderson MRN: [id number] Date of Birth: 04/04/1971   

## 2017-11-27 ENCOUNTER — Ambulatory Visit: Payer: Managed Care, Other (non HMO) | Admitting: Physical Therapy

## 2017-11-30 ENCOUNTER — Encounter: Payer: Self-pay | Admitting: Physical Therapy

## 2017-11-30 ENCOUNTER — Ambulatory Visit: Payer: Managed Care, Other (non HMO) | Admitting: Physical Therapy

## 2017-11-30 DIAGNOSIS — M542 Cervicalgia: Secondary | ICD-10-CM | POA: Diagnosis not present

## 2017-11-30 DIAGNOSIS — M25511 Pain in right shoulder: Secondary | ICD-10-CM

## 2017-11-30 DIAGNOSIS — R252 Cramp and spasm: Secondary | ICD-10-CM

## 2017-11-30 NOTE — Therapy (Signed)
[place], [place], [address] Phone: [phone]   Fax:  [phone]  Physical Therapy Treatment  Patient Details  Name: Geoffrey Henderson MRN: [id number] Date of Birth: [birth year] Referring Provider: Bennett Scrape   Encounter Date: 11/30/2017  PT End of Session - 11/30/17 1338    Visit Number  6    Date for PT Re-Evaluation  12/30/17    PT Start Time  1300    PT Stop Time  1345    PT Time Calculation (min)  45 min    Activity Tolerance  Patient tolerated treatment well    Behavior During Therapy  [facility] for tasks assessed/performed       History reviewed. No pertinent past medical history.  Past Surgical History:  Procedure Laterality Date  . HERNIA REPAIR      There were no vitals filed for this visit.  Subjective Assessment - 11/30/17 1305    Subjective  "It is getting better, Im not going to lie, this past weekend I only took the muscle relaxer twice"    Currently in Pain?  No/denies    Pain Score  0-No pain                No data recorded       [facility] Adult PT Treatment/Exercise - 11/30/17 0001      Neck Exercises: Machines for Strengthening   UBE (Upper Arm Bike)  constant work 20watts 100fd/3rev    CEditor, commissioning B UE 25# x 15 reps     Lat Pull  25# 2x 15 reps       Neck Exercises: Theraband   Shoulder Extension  20 reps;Green;Blue    Rows  20 reps;Green    Other Theraband Exercises  shoulder ER green tband 2x10       Neck Exercises: Standing   Wall Push Ups  20 reps               PT Short Term Goals - 11/07/17 1646      PT SHORT TERM GOAL #1   Title  independent with intial HEP    Status  Partially Met        PT Long Term Goals - 11/09/17 1657      PT LONG TERM GOAL #1   Title  decrease pain 50% for ADLs and to be able to work    Status  On-going      PT LONG TERM GOAL #2   Title  reports being able to work with no pain or limitation    Status   On-going            Plan - 11/30/17 1340    Clinical Impression Statement  Pt reports improvement overall. He stated decrease pain only stiffness. Postural cueing needed with standing ER and rows. decline E=Stim and moist post treatment.     Rehab Potential  Good    PT Frequency  2x / week    PT Duration  8 weeks    PT Treatment/Interventions  Electrical Stimulation;Moist Heat;Therapeutic exercise;Therapeutic activities;Patient/family education;Passive range of motion;Manual techniques    PT Next Visit Plan  see how the dry needling did, continue with exercises       Patient will benefit from skilled therapeutic intervention in order to improve the following deficits and impairments:  Pain, Increased muscle spasms, Decreased endurance, Decreased activity tolerance, Decreased strength, Impaired UE functional use, Impaired flexibility  Visit  Diagnosis: Acute pain of right shoulder  Cervicalgia  Cramp and spasm     Problem List There are no active problems to display for this patient.   Scot Jun. PTA 11/30/2017, 1:42 PM  [place], [place], [address] Phone: [phone]   Fax:  [phone]  Name: Geoffrey Henderson MRN: [id number] Date of Birth: [birth year]

## 2017-12-05 ENCOUNTER — Encounter: Payer: Self-pay | Admitting: Physical Therapy

## 2017-12-05 ENCOUNTER — Ambulatory Visit: Payer: Managed Care, Other (non HMO) | Attending: Internal Medicine | Admitting: Physical Therapy

## 2017-12-05 DIAGNOSIS — M25511 Pain in right shoulder: Secondary | ICD-10-CM | POA: Diagnosis present

## 2017-12-05 DIAGNOSIS — R252 Cramp and spasm: Secondary | ICD-10-CM | POA: Diagnosis present

## 2017-12-05 DIAGNOSIS — M542 Cervicalgia: Secondary | ICD-10-CM | POA: Insufficient documentation

## 2017-12-05 NOTE — Therapy (Signed)
[place], [place], [address] Phone: [phone]   Fax:  [phone]  Physical Therapy Treatment  Patient Details  Name: Geoffrey Henderson MRN: [id number] Date of Birth: 1971-04-09 Referring Provider: Bennett Scrape   Encounter Date: 12/05/2017  PT End of Session - 12/05/17 1509    Visit Number  7    Date for PT Re-Evaluation  12/30/17    PT Start Time  1430    PT Stop Time  1509    PT Time Calculation (min)  39 min    Activity Tolerance  Patient tolerated treatment well    Behavior During Therapy  [facility] for tasks assessed/performed       History reviewed. No pertinent past medical history.  Past Surgical History:  Procedure Laterality Date  . HERNIA REPAIR      There were no vitals filed for this visit.  Subjective Assessment - 12/05/17 1439    Subjective  "It is doing good"    Currently in Pain?  Yes    Pain Score  7     Pain Location  Shoulder    Pain Orientation  Right                       [facility] Adult PT Treatment/Exercise - 12/05/17 0001      Neck Exercises: Machines for Strengthening   UBE (Upper Arm Bike)  constant work 20watts 21fd/3rev    CEditor, commissioning B UE 35# 2x15 reps     Lat Pull  35# 2x 15 reps       Neck Exercises: Theraband   Shoulder Extension  20 reps;Blue    Rows  20 reps;Blue    Horizontal ABduction  Blue;20 reps      Neck Exercises: Standing   Wall Push Ups  20 reps               PT Short Term Goals - 11/07/17 1646      PT SHORT TERM GOAL #1   Title  independent with intial HEP    Status  Partially Met        PT Long Term Goals - 11/09/17 1657      PT LONG TERM GOAL #1   Title  decrease pain 50% for ADLs and to be able to work    Status  On-going      PT LONG TERM GOAL #2   Title  reports being able to work with no pain or limitation    Status  On-going            Plan - 12/05/17 1510    Clinical Impression Statement   Pt continues to reports an overall improvement despite high pain rating. Good strength and ROM with all activities. Decline modality post treatment.    Rehab Potential  Good    PT Frequency  2x / week    PT Duration  8 weeks    PT Next Visit Plan  continue with exercises       Patient will benefit from skilled therapeutic intervention in order to improve the following deficits and impairments:  Pain, Increased muscle spasms, Decreased endurance, Decreased activity tolerance, Decreased strength, Impaired UE functional use, Impaired flexibility  Visit Diagnosis: Acute pain of right shoulder  Cervicalgia  Cramp and spasm     Problem List There are no active problems to display for this patient.   RScot Jun PTA  12/05/2017, 3:14 PM  [place], [place], [address] Phone: [phone]   Fax:  [phone]  Name: Geoffrey Henderson MRN: [id number] Date of Birth: Jan 26, 1971

## 2017-12-08 ENCOUNTER — Ambulatory Visit: Payer: Managed Care, Other (non HMO) | Admitting: Physical Therapy

## 2017-12-08 ENCOUNTER — Encounter: Payer: Self-pay | Admitting: Physical Therapy

## 2017-12-08 DIAGNOSIS — M542 Cervicalgia: Secondary | ICD-10-CM

## 2017-12-08 DIAGNOSIS — M25511 Pain in right shoulder: Secondary | ICD-10-CM

## 2017-12-08 DIAGNOSIS — R252 Cramp and spasm: Secondary | ICD-10-CM

## 2017-12-08 NOTE — Therapy (Signed)
[place], [place], [address] Phone: [phone]   Fax:  [phone]  Physical Therapy Treatment  Patient Details  Name: Geoffrey Henderson MRN: [id number] Date of Birth: 01-Feb-1971 Referring Provider: Bennett Scrape   Encounter Date: 12/08/2017  PT End of Session - 12/08/17 0853    Activity Tolerance  Patient tolerated treatment well    Behavior During Therapy  [facility] for tasks assessed/performed       History reviewed. No pertinent past medical history.  Past Surgical History:  Procedure Laterality Date   HERNIA REPAIR      There were no vitals filed for this visit.  Subjective Assessment - 12/08/17 0818    Subjective  Shoulder is feeling a little better.  Does have some instance of Right hand numbness/ tingling during Right sidelying while sleeping    Currently in Pain?  Yes    Pain Score  7     Pain Location  Shoulder    Pain Orientation  Right    Pain Radiating Towards  Right shoulder down to Right buttock    Pain Frequency  Intermittent                       [facility] Adult PT Treatment/Exercise - 12/08/17 0001      Neck Exercises: Machines for Strengthening   UBE (Upper Arm Bike)  L4 X6'    Cybex Row  B UE 35# 2x15 reps     Lat Pull  35# 2x 15 reps       Neck Exercises: Theraband   Shoulder Extension  20 reps;Blue    Shoulder External Rotation  Blue;20 reps    Horizontal ABduction  Blue;20 reps      Neck Exercises: Standing   Wall Push Ups  20 reps    Other Standing Exercises  tricep extension 2x15 45#               PT Short Term Goals - 11/07/17 1646      PT SHORT TERM GOAL #1   Title  independent with intial HEP    Status  Partially Met        PT Long Term Goals - 11/09/17 1657      PT LONG TERM GOAL #1   Title  decrease pain 50% for ADLs and to be able to work    Status  On-going      PT LONG TERM GOAL #2   Title  reports being able to work with  no pain or limitation    Status  On-going            Plan - 12/08/17 0851    Clinical Impression Statement  Fatigues quickly with PREs.  Does demo good tolerance, form, and technique.  Improving postural awareness/ correction.         Patient will benefit from skilled therapeutic intervention in order to improve the following deficits and impairments:     Visit Diagnosis: Acute pain of right shoulder  Cervicalgia  Cramp and spasm     Problem List There are no active problems to display for this patient.   Olean Ree, PTA 12/08/2017, 8:53 AM  [place], [place], [address] Phone: [phone]   Fax:  [phone]  Name: GABRYEL FILES MRN: [id number] Date of Birth: 12/25/1970

## 2017-12-13 ENCOUNTER — Encounter: Payer: Self-pay | Admitting: Physical Therapy

## 2017-12-13 ENCOUNTER — Ambulatory Visit: Payer: Managed Care, Other (non HMO) | Admitting: Physical Therapy

## 2017-12-13 DIAGNOSIS — M25511 Pain in right shoulder: Secondary | ICD-10-CM | POA: Diagnosis not present

## 2017-12-13 DIAGNOSIS — M542 Cervicalgia: Secondary | ICD-10-CM

## 2017-12-13 DIAGNOSIS — R252 Cramp and spasm: Secondary | ICD-10-CM

## 2017-12-13 NOTE — Therapy (Signed)
[place], [place], [address] Phone: [phone]   Fax:  [phone]  Physical Therapy Treatment  Patient Details  Name: Geoffrey Henderson MRN: [id number] Date of Birth: [birth year] Referring Provider: Bennett Scrape   Encounter Date: 12/13/2017  PT End of Session - 12/13/17 1557    Visit Number  9    Date for PT Re-Evaluation  12/30/17    PT Start Time  1528    PT Stop Time  1616    PT Time Calculation (min)  48 min    Activity Tolerance  Patient tolerated treatment well    Behavior During Therapy  [facility] for tasks assessed/performed       History reviewed. No pertinent past medical history.  Past Surgical History:  Procedure Laterality Date  . HERNIA REPAIR      There were no vitals filed for this visit.  Subjective Assessment - 12/13/17 1530    Subjective  Doing better, still some tightness and tension in the neck and upper traps, he reports that lately he is doing a lot of work    Currently in Pain?  Yes    Pain Score  5     Pain Location  Neck    Aggravating Factors   work and stress, cold air    Pain Relieving Factors  treatment and heat seems to be helping                       [facility] Adult PT Treatment/Exercise - 12/13/17 0001      Neck Exercises: Machines for Strengthening   UBE (Upper Arm Bike)  L4 X6'    Cybex Row  B UE 35# 2x15 reps     Lat Pull  35# 2x 15 reps       Neck Exercises: Standing   Other Standing Exercises  tricep extension 2x15 45#      Moist Heat Therapy   Number Minutes Moist Heat  15 Minutes    Moist Heat Location  Shoulder;Cervical      Electrical Stimulation   Electrical Stimulation Location  neck and right shoulder    Electrical Stimulation Action  IFC    Electrical Stimulation Parameters  supine    Electrical Stimulation Goals  Pain      Manual Therapy   Manual Therapy  Soft tissue mobilization;Neural Stretch    Soft tissue mobilization   to the upper traps and the cervical parapsinals    Passive ROM  end range cervical ROM    Neural Stretch  R median nerve       Trigger Point Dry Needling - 12/13/17 1556    Consent Given?  Yes    Muscles Treated Upper Body  Upper trapezius;Levator scapulae    Upper Trapezius Response  Twitch reponse elicited;Palpable increased muscle length    Levator Scapulae Response  Twitch response elicited;Palpable increased muscle length             PT Short Term Goals - 11/07/17 1646      PT SHORT TERM GOAL #1   Title  independent with intial HEP    Status  Partially Met        PT Long Term Goals - 12/13/17 1558      PT LONG TERM GOAL #1   Title  decrease pain 50% for ADLs and to be able to work    Status  Partially Met  PT LONG TERM GOAL #2   Title  reports being able to work with no pain or limitation    Status  Partially Met      PT LONG TERM GOAL #3   Title  reports dressing with no pain or limitation    Status  Partially Met      PT LONG TERM GOAL #4   Title  improve shoulder MMT (flex and abd) to 4+/5 without pain    Status  Partially Met            Plan - 12/13/17 1557    Clinical Impression Statement  Patient with some increased spasms in the upper trap, reported that the dry needles helped previously and wanted to try again, good ROM of the cervical spine.    PT Next Visit Plan  possibly look at what he can do on own as well as assure he uses good body mechanics at work    [employer] and Agree with Plan of Care  Patient       Patient will benefit from skilled therapeutic intervention in order to improve the following deficits and impairments:  Pain, Increased muscle spasms, Decreased endurance, Decreased activity tolerance, Decreased strength, Impaired UE functional use, Impaired flexibility  Visit Diagnosis: Acute pain of right shoulder  Cervicalgia  Cramp and spasm     Problem List There are no active problems to display for this  patient.   Sumner Boast., PT 12/13/2017, 3:59 PM  [place], [place], [address] Phone: [phone]   Fax:  [phone]  Name: Geoffrey Henderson MRN: [id number] Date of Birth: 1970/12/06

## 2017-12-14 ENCOUNTER — Ambulatory Visit: Payer: Managed Care, Other (non HMO) | Admitting: Physical Therapy

## 2017-12-14 ENCOUNTER — Encounter: Payer: Self-pay | Admitting: Physical Therapy

## 2017-12-14 DIAGNOSIS — M25511 Pain in right shoulder: Secondary | ICD-10-CM

## 2017-12-14 DIAGNOSIS — M542 Cervicalgia: Secondary | ICD-10-CM

## 2017-12-14 DIAGNOSIS — R252 Cramp and spasm: Secondary | ICD-10-CM

## 2017-12-14 NOTE — Therapy (Signed)
[place], [place], [address] Phone: [phone]   Fax:  [phone]  Physical Therapy Treatment  Patient Details  Name: Geoffrey Henderson MRN: [id number] Date of Birth: 19-Apr-1971 Referring Provider: Bennett Scrape   Encounter Date: 12/14/2017  PT End of Session - 12/14/17 0841    Visit Number  10    Date for PT Re-Evaluation  12/30/17    PT Start Time  0800    PT Stop Time  0854    PT Time Calculation (min)  54 min    Activity Tolerance  Patient tolerated treatment well    Behavior During Therapy  [facility] for tasks assessed/performed       History reviewed. No pertinent past medical history.  Past Surgical History:  Procedure Laterality Date  . HERNIA REPAIR      There were no vitals filed for this visit.  Subjective Assessment - 12/14/17 0804    Subjective  "Everything good"    Pain Score  5     Pain Location  -- shoulder and back    Pain Orientation  Right                       [facility] Adult PT Treatment/Exercise - 12/14/17 0001      Neck Exercises: Machines for Strengthening   UBE (Upper Arm Bike)  constant work 30watts 32fd/3rev    Cybex Row  B UE 35# 2x15 reps     Lat Pull  35# 2x 15 reps       Neck Exercises: Theraband   Shoulder Extension  20 reps;Blue    Shoulder External Rotation  Blue;20 reps    Horizontal ABduction  Blue;20 reps      Neck Exercises: Standing   Other Standing Exercises  tricep extension 2x15 45#      Moist Heat Therapy   Number Minutes Moist Heat  15 Minutes    Moist Heat Location  Shoulder;Cervical      Electrical Stimulation   Electrical Stimulation Location  neck and right shoulder    Electrical Stimulation Action  IFC    Electrical Stimulation Parameters  supine    Electrical Stimulation Goals  Pain       Trigger Point Dry Needling - 12/13/17 1556    Consent Given?  Yes    Muscles Treated Upper Body  Upper trapezius;Levator  scapulae    Upper Trapezius Response  Twitch reponse elicited;Palpable increased muscle length    Levator Scapulae Response  Twitch response elicited;Palpable increased muscle length             PT Short Term Goals - 11/07/17 1646      PT SHORT TERM GOAL #1   Title  independent with intial HEP    Status  Partially Met        PT Long Term Goals - 12/13/17 1558      PT LONG TERM GOAL #1   Title  decrease pain 50% for ADLs and to be able to work    Status  Partially Met      PT LONG TERM GOAL #2   Title  reports being able to work with no pain or limitation    Status  Partially Met      PT LONG TERM GOAL #3   Title  reports dressing with no pain or limitation    Status  Partially Met      PT LONG  TERM GOAL #4   Title  improve shoulder MMT (flex and abd) to 4+/5 without pain    Status  Partially Met            Plan - 12/14/17 0842    Clinical Impression Statement  ROM and strength remains good. Reports soreness last night from dry needling. Pr stated that's she is always in pain. Stated that he takes OTC medicine at work but dose not tell anyone.      Rehab Potential  Good    PT Treatment/Interventions  Electrical Stimulation;Moist Heat;Therapeutic exercise;Therapeutic activities;Patient/family education;Passive range of motion;Manual techniques    PT Next Visit Plan  possibly look at what he can do on own as well as assure he uses good body mechanics at work       Patient will benefit from skilled therapeutic intervention in order to improve the following deficits and impairments:  Pain, Increased muscle spasms, Decreased endurance, Decreased activity tolerance, Decreased strength, Impaired UE functional use, Impaired flexibility  Visit Diagnosis: Acute pain of right shoulder  Cramp and spasm  Cervicalgia     Problem List There are no active problems to display for this patient.   Scot Jun, PTA 12/14/2017, 8:45 AM  [place], [place], [address] Phone: [phone]   Fax:  [phone]  Name: Geoffrey Henderson MRN: [id number] Date of Birth: Apr 06, 1971

## 2017-12-19 ENCOUNTER — Ambulatory Visit: Payer: Managed Care, Other (non HMO) | Admitting: Physical Therapy

## 2018-01-02 ENCOUNTER — Ambulatory Visit: Payer: Managed Care, Other (non HMO) | Admitting: Physical Therapy

## 2018-01-02 ENCOUNTER — Encounter: Payer: Self-pay | Admitting: Physical Therapy

## 2018-01-02 DIAGNOSIS — M25511 Pain in right shoulder: Secondary | ICD-10-CM | POA: Diagnosis not present

## 2018-01-02 DIAGNOSIS — R252 Cramp and spasm: Secondary | ICD-10-CM

## 2018-01-02 DIAGNOSIS — M542 Cervicalgia: Secondary | ICD-10-CM

## 2018-01-02 NOTE — Therapy (Signed)
[place], [place], [address] Phone: [phone]   Fax:  [phone]  Physical Therapy Treatment  Patient Details  Name: Geoffrey Henderson MRN: [id number] Date of Birth: 1970-11-30 Referring Provider: Bennett Scrape   Encounter Date: 01/02/2018  PT End of Session - 01/02/18 0839    Visit Number  11    PT Start Time  0800    PT Stop Time  0855    PT Time Calculation (min)  55 min    Activity Tolerance  Patient tolerated treatment well    Behavior During Therapy  [facility] for tasks assessed/performed       History reviewed. No pertinent past medical history.  Past Surgical History:  Procedure Laterality Date  . HERNIA REPAIR      There were no vitals filed for this visit.  Subjective Assessment - 01/02/18 0800    Subjective  "I feel good man"    Currently in Pain?  No/denies    Pain Score  0-No pain         [facility] PT Assessment - 01/02/18 0001      Strength   Right/Left Shoulder  Right    Right Shoulder Flexion  5/5    Right Shoulder ABduction  5/5    Right/Left Hip  Right    Right Hip Flexion  5/5                   [facility] Adult PT Treatment/Exercise - 01/02/18 0001      Neck Exercises: Machines for Strengthening   UBE (Upper Arm Bike)  constant work 30watts 75fd/3rev    CEditor, commissioning B UE 45# 2x15 reps     Lat Pull  35# 2x 15 reps       Neck Exercises: Theraband   Shoulder Extension  20 reps 15lb    Shoulder External Rotation  Blue;20 reps    Horizontal ABduction  Blue;20 reps      Neck Exercises: Standing   Other Standing Exercises  tricep extension 2x15 45#    Other Standing Exercises  bicep curls 45lb 2x15      Moist Heat Therapy   Number Minutes Moist Heat  15 Minutes    Moist Heat Location  Shoulder;Cervical      Electrical Stimulation   Electrical Stimulation Location  neck and right shoulder    Electrical Stimulation Action  IFC    Electrical Stimulation  Parameters  supine    Electrical Stimulation Goals  Pain               PT Short Term Goals - 11/07/17 1646      PT SHORT TERM GOAL #1   Title  independent with intial HEP    Status  Partially Met        PT Long Term Goals - 01/02/18 09735     PT LONG TERM GOAL #1   Title  decrease pain 50% for ADLs and to be able to work      PT LONG TERM GOAL #2   Title  reports being able to work with no pain or limitation    Status  Achieved      PT LONG TERM GOAL #3   Title  reports dressing with no pain or limitation    Status  Achieved      PT LONG TERM GOAL #4   Title  improve shoulder MMT (flex and abd) to 4+/5  without pain    Status  Achieved      PT LONG TERM GOAL #5   Title  verbalizes and demonstrates good posture and body mechanics and importance of them    Status  Achieved            Plan - 01/02/18 0840    Clinical Impression Statement  Pt has progressed towards all goals, Good strength and ROM with all exercises. He still reports pain with some ADL and at work.    PT Frequency  2x / week    PT Duration  8 weeks    PT Treatment/Interventions  Electrical Stimulation;Moist Heat;Therapeutic exercise;Therapeutic activities;Patient/family education;Passive range of motion;Manual techniques    PT Next Visit Plan  D/C within next 2 visits       Patient will benefit from skilled therapeutic intervention in order to improve the following deficits and impairments:  Pain, Increased muscle spasms, Decreased endurance, Decreased activity tolerance, Decreased strength, Impaired UE functional use, Impaired flexibility  Visit Diagnosis: Cramp and spasm  Acute pain of right shoulder  Cervicalgia     Problem List There are no active problems to display for this patient.   Scot Jun, PTA 01/02/2018, 8:41 AM  [place] [address] [place], [place], [address] Phone: [phone]   Fax:   [phone]  Name: Geoffrey Henderson MRN: [id number] Date of Birth: [birth year]

## 2018-01-04 ENCOUNTER — Ambulatory Visit: Payer: Managed Care, Other (non HMO) | Attending: Internal Medicine | Admitting: Physical Therapy

## 2018-01-04 ENCOUNTER — Encounter: Payer: Self-pay | Admitting: Physical Therapy

## 2018-01-04 DIAGNOSIS — M25511 Pain in right shoulder: Secondary | ICD-10-CM

## 2018-01-04 DIAGNOSIS — M542 Cervicalgia: Secondary | ICD-10-CM | POA: Diagnosis present

## 2018-01-04 DIAGNOSIS — R252 Cramp and spasm: Secondary | ICD-10-CM | POA: Diagnosis present

## 2018-01-04 NOTE — Therapy (Signed)
[place], [place], [address] Phone: [phone]   Fax:  [phone]  Physical Therapy Treatment  Patient Details  Name: Geoffrey Henderson MRN: [id number] Date of Birth: Oct 12, 1970 Referring Provider: Bennett Scrape   Encounter Date: 01/04/2018  PT End of Session - 01/04/18 0845    Visit Number  12    Date for PT Re-Evaluation  01/30/18    PT Start Time  0805    PT Stop Time  0846    PT Time Calculation (min)  41 min    Activity Tolerance  Patient tolerated treatment well    Behavior During Therapy  [facility] for tasks assessed/performed       History reviewed. No pertinent past medical history.  Past Surgical History:  Procedure Laterality Date  . HERNIA REPAIR      There were no vitals filed for this visit.  Subjective Assessment - 01/04/18 0811    Subjective  "I feel good"    Currently in Pain?  No/denies    Pain Score  0-No pain                       [facility] Adult PT Treatment/Exercise - 01/04/18 0001      Neck Exercises: Machines for Strengthening   UBE (Upper Arm Bike)  constant work 30watts 22fd/3rev    CEditor, commissioning B UE 45# 2x15 reps     Lat Pull  35# 2x 15 reps       Neck Exercises: Theraband   Shoulder Extension  20 reps;Blue    Shoulder External Rotation  Blue;20 reps    Horizontal ABduction  Blue;20 reps      Neck Exercises: Standing   Other Standing Exercises  tricep extension 2x15 45#    Other Standing Exercises  bicep curls 35lb 3x15               PT Short Term Goals - 11/07/17 1646      PT SHORT TERM GOAL #1   Title  independent with intial HEP    Status  Partially Met        PT Long Term Goals - 01/02/18 04097     PT LONG TERM GOAL #1   Title  decrease pain 50% for ADLs and to be able to work      PT LONG TERM GOAL #2   Title  reports being able to work with no pain or limitation    Status  Achieved      PT LONG TERM GOAL #3   Title   reports dressing with no pain or limitation    Status  Achieved      PT LONG TERM GOAL #4   Title  improve shoulder MMT (flex and abd) to 4+/5 without pain    Status  Achieved      PT LONG TERM GOAL #5   Title  verbalizes and demonstrates good posture and body mechanics and importance of them    Status  Achieved            Plan - 01/04/18 0847    Clinical Impression Statement  Continues to do well in therapy with good strength and ROM, 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 Possible D/C next visit.    Rehab Potential  Good    PT Frequency  2x / week    PT Duration  8 weeks  PT Treatment/Interventions  Electrical Stimulation;Moist Heat;Therapeutic exercise;Therapeutic activities;Patient/family education;Passive range of motion;Manual techniques    PT Next Visit Plan  D/C  next visit        Patient will benefit from skilled therapeutic intervention in order to improve the following deficits and impairments:  Pain, Increased muscle spasms, Decreased endurance, Decreased activity tolerance, Decreased strength, Impaired UE functional use, Impaired flexibility  Visit Diagnosis: Acute pain of right shoulder  Cramp and spasm  Cervicalgia     Problem List There are no active problems to display for this patient.  Scot Jun, PTA 01/04/2018, 8:51 AM  [place] [address] [place], [place], [address] Phone: [phone]   Fax:  [phone]  Name: GARDINER ESPANA MRN: [id number] Date of Birth: 19-Nov-1970

## 2018-01-08 ENCOUNTER — Ambulatory Visit: Payer: Managed Care, Other (non HMO) | Admitting: Physical Therapy

## 2018-01-08 ENCOUNTER — Encounter: Payer: Self-pay | Admitting: Physical Therapy

## 2018-01-08 DIAGNOSIS — M542 Cervicalgia: Secondary | ICD-10-CM

## 2018-01-08 DIAGNOSIS — R252 Cramp and spasm: Secondary | ICD-10-CM

## 2018-01-08 DIAGNOSIS — M25511 Pain in right shoulder: Secondary | ICD-10-CM

## 2018-01-08 NOTE — Therapy (Signed)
[facility] [address] [place], [place], [address] Phone: [phone]   Fax:  [phone]  Physical Therapy Treatment  Patient Details  Name: Geoffrey Henderson MRN: [id number] Date of Birth: 01/31/1971 Referring Provider: Tije-Sie   Encounter Date: 01/08/2018  PT End of Session - 01/08/18 1724    Visit Number  13    Date for PT Re-Evaluation  01/30/18    PT Start Time  1705    PT Stop Time  1750    PT Time Calculation (min)  45 min    Activity Tolerance  Patient tolerated treatment well    Behavior During Therapy  [facility] for tasks assessed/performed       History reviewed. No pertinent past medical history.  Past Surgical History:  Procedure Laterality Date  . HERNIA REPAIR      There were no vitals filed for this visit.  Subjective Assessment - 01/08/18 1717    Subjective  Patient reports that he has overall been doing pretty good.  He reports that he has been very busy at work and has had some added stress of his dad having to go to a nursing home.    Currently in Pain?  Yes    Pain Score  3     Pain Location  Neck    Aggravating Factors   stress    Pain Relieving Factors  the treatment and the exercises "rally have helped"                       [facility] Adult PT Treatment/Exercise - 01/08/18 0001      Therapeutic Activites    Therapeutic Activities  Work Simulation;Lifting    Lifting  went over weight close to body, pivot turns, no reaching far out with weight    Work Simulation  went over posture with activities, tried to problem solve with him on various activities that he may have to do at work different tasks to work on with him reaching, lifting, getting in tight spaces etc..      Moist Heat Therapy   Number Minutes Moist Heat  15 Minutes    Moist Heat Location  Shoulder;Cervical      Electrical Stimulation   Electrical Stimulation Location  neck and right shoulder    Electrical  Stimulation Action  IFC    Electrical Stimulation Parameters  supine    Electrical Stimulation Goals  Pain               PT Short Term Goals - 11/07/17 1646      PT SHORT TERM GOAL #1   Title  independent with intial HEP    Status  Partially Met        PT Long Term Goals - 01/08/18 1727      PT LONG TERM GOAL #1   Title  decrease pain 50% for ADLs and to be able to work    Status  Partially Met      PT LONG TERM GOAL #2   Title  reports being able to work with no pain or limitation    Status  Achieved      PT LONG TERM GOAL #3   Title  reports dressing with no pain or limitation    Status  Achieved      PT LONG TERM GOAL #4   Title  improve shoulder MMT (flex and abd) to 4+/5 without pain      Status  Achieved      PT LONG TERM GOAL #5   Title  verbalizes and demonstrates good posture and body mechanics and importance of them    Status  Achieved            Plan - 01/08/18 1724    Clinical Impression Statement  Patient overall has done very good with his recovery, he has had some ups and downs with him working and stress, he has better ROM, better tolerance to activities had a better understanding of how to decrease stress on the spine with posture and body mechanics.  He however continues to have some pain and spasms, he reports that he is busy at work and would be able to continue if we had later hours or worked on Saturday.    PT Next Visit Plan  we will place on hold as he is having more dificulty getting to appointments, he is doing much better but still having some pain and spasms, he is to try on his own.    Consulted and Agree with Plan of Care  Patient       Patient will benefit from skilled therapeutic intervention in order to improve the following deficits and impairments:  Pain, Increased muscle spasms, Decreased endurance, Decreased activity tolerance, Decreased strength, Impaired UE functional use, Impaired flexibility  Visit Diagnosis: Acute  pain of right shoulder  Cramp and spasm  Cervicalgia     Problem List There are no active problems to display for this patient.   ALBRIGHT,MICHAEL W., PT 01/08/2018, 5:28 PM  [facility] [address] [place], [place], [address] Phone: [phone]   Fax:  [phone]  Name: Geoffrey Henderson MRN: [id number] Date of Birth: 09/28/1970   
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
## 2022-02-01 ENCOUNTER — Emergency Department (HOSPITAL_BASED_OUTPATIENT_CLINIC_OR_DEPARTMENT_OTHER): Payer: 59

## 2022-02-01 ENCOUNTER — Encounter (HOSPITAL_BASED_OUTPATIENT_CLINIC_OR_DEPARTMENT_OTHER): Payer: Self-pay | Admitting: Emergency Medicine

## 2022-02-01 ENCOUNTER — Other Ambulatory Visit: Payer: Self-pay

## 2022-02-01 ENCOUNTER — Observation Stay (HOSPITAL_BASED_OUTPATIENT_CLINIC_OR_DEPARTMENT_OTHER)
Admission: EM | Admit: 2022-02-01 | Discharge: 2022-02-02 | Disposition: A | Discharge status: Home / Self Care | Payer: 59 | Attending: Surgery | Admitting: Surgery

## 2022-02-01 ENCOUNTER — Encounter (HOSPITAL_COMMUNITY)
Admission: EM | Disposition: A | Discharge status: Home / Self Care | Payer: Self-pay | Source: Home / Self Care | Attending: Emergency Medicine

## 2022-02-01 ENCOUNTER — Observation Stay (HOSPITAL_BASED_OUTPATIENT_CLINIC_OR_DEPARTMENT_OTHER): Payer: 59 | Admitting: Certified Registered"

## 2022-02-01 ENCOUNTER — Observation Stay (HOSPITAL_COMMUNITY): Payer: 59 | Admitting: Certified Registered"

## 2022-02-01 DIAGNOSIS — Z79899 Other long term (current) drug therapy: Secondary | ICD-10-CM | POA: Diagnosis not present

## 2022-02-01 DIAGNOSIS — K3580 Unspecified acute appendicitis: Secondary | ICD-10-CM

## 2022-02-01 DIAGNOSIS — Z20822 Contact with and (suspected) exposure to covid-19: Secondary | ICD-10-CM | POA: Insufficient documentation

## 2022-02-01 DIAGNOSIS — R1031 Right lower quadrant pain: Secondary | ICD-10-CM | POA: Diagnosis present

## 2022-02-01 DIAGNOSIS — Z87891 Personal history of nicotine dependence: Secondary | ICD-10-CM | POA: Diagnosis not present

## 2022-02-01 DIAGNOSIS — R198 Other specified symptoms and signs involving the digestive system and abdomen: Secondary | ICD-10-CM

## 2022-02-01 HISTORY — PX: LAPAROSCOPIC APPENDECTOMY: SHX408

## 2022-02-01 LAB — URINALYSIS, ROUTINE W REFLEX MICROSCOPIC
Bilirubin Urine: NEGATIVE
Glucose, UA: NEGATIVE mg/dL
Hgb urine dipstick: NEGATIVE
Ketones, ur: NEGATIVE mg/dL
Leukocytes,Ua: NEGATIVE
Nitrite: NEGATIVE
Protein, ur: NEGATIVE mg/dL
Specific Gravity, Urine: 1.01 (ref 1.005–1.030)
pH: 6.5 (ref 5.0–8.0)

## 2022-02-01 LAB — CBC WITH DIFFERENTIAL/PLATELET
Abs Immature Granulocytes: 0.03 10*3/uL (ref 0.00–0.07)
Basophils Absolute: 0 10*3/uL (ref 0.0–0.1)
Basophils Relative: 0 %
Eosinophils Absolute: 0.1 10*3/uL (ref 0.0–0.5)
Eosinophils Relative: 1 %
HCT: 46.3 % (ref 39.0–52.0)
Hemoglobin: 16.4 g/dL (ref 13.0–17.0)
Immature Granulocytes: 0 %
Lymphocytes Relative: 13 %
Lymphs Abs: 1 10*3/uL (ref 0.7–4.0)
MCH: 32.5 pg (ref 26.0–34.0)
MCHC: 35.4 g/dL (ref 30.0–36.0)
MCV: 91.7 fL (ref 80.0–100.0)
Monocytes Absolute: 0.3 10*3/uL (ref 0.1–1.0)
Monocytes Relative: 4 %
Neutro Abs: 6.4 10*3/uL (ref 1.7–7.7)
Neutrophils Relative %: 82 %
Platelets: 201 10*3/uL (ref 150–400)
RBC: 5.05 MIL/uL (ref 4.22–5.81)
RDW: 11.9 % (ref 11.5–15.5)
WBC: 7.8 10*3/uL (ref 4.0–10.5)
nRBC: 0 % (ref 0.0–0.2)

## 2022-02-01 LAB — COMPREHENSIVE METABOLIC PANEL
ALT: 19 U/L (ref 0–44)
AST: 23 U/L (ref 15–41)
Albumin: 4.2 g/dL (ref 3.5–5.0)
Alkaline Phosphatase: 31 U/L — ABNORMAL LOW (ref 38–126)
Anion gap: 7 (ref 5–15)
BUN: 11 mg/dL (ref 6–20)
CO2: 25 mmol/L (ref 22–32)
Calcium: 9 mg/dL (ref 8.9–10.3)
Chloride: 102 mmol/L (ref 98–111)
Creatinine, Ser: 1.15 mg/dL (ref 0.61–1.24)
GFR, Estimated: >60 mL/min (ref >60)
Glucose, Bld: 108 mg/dL — ABNORMAL HIGH (ref 70–99)
Potassium: 3.8 mmol/L (ref 3.5–5.1)
Sodium: 134 mmol/L — ABNORMAL LOW (ref 135–145)
Total Bilirubin: 0.9 mg/dL (ref 0.3–1.2)
Total Protein: 7.2 g/dL (ref 6.5–8.1)

## 2022-02-01 LAB — RESP PANEL BY RT-PCR (FLU A&B, COVID) ARPGX2
Influenza A by PCR: NEGATIVE
Influenza B by PCR: NEGATIVE
SARS Coronavirus 2 by RT PCR: NEGATIVE

## 2022-02-01 LAB — LIPASE, BLOOD: Lipase: 31 U/L (ref 11–51)

## 2022-02-01 LAB — LACTIC ACID, PLASMA
Lactic Acid, Venous: 1.6 mmol/L (ref 0.5–1.9)
Lactic Acid, Venous: 1 mmol/L (ref 0.5–1.9)

## 2022-02-01 SURGERY — APPENDECTOMY, LAPAROSCOPIC
Anesthesia: General

## 2022-02-01 MED ORDER — ROCURONIUM BROMIDE 10 MG/ML (PF) SYRINGE
PREFILLED_SYRINGE | INTRAVENOUS | Status: AC
  Filled 2022-02-01: qty 10

## 2022-02-01 MED ORDER — SUCCINYLCHOLINE CHLORIDE 200 MG/10ML IV SOSY
PREFILLED_SYRINGE | INTRAVENOUS | Status: AC
  Filled 2022-02-01: qty 10

## 2022-02-01 MED ORDER — KETOROLAC TROMETHAMINE 30 MG/ML IJ SOLN
15.0000 mg | Freq: Three times a day (TID) | INTRAMUSCULAR | Status: DC | PRN
  Administered 2022-02-01: 15 mg via INTRAVENOUS
  Filled 2022-02-01: qty 1

## 2022-02-01 MED ORDER — DIPHENHYDRAMINE HCL 50 MG/ML IJ SOLN: 25.0000 mg | Freq: Four times a day (QID) | INTRAMUSCULAR | Status: DC | PRN

## 2022-02-01 MED ORDER — SODIUM CHLORIDE 0.9 % IV BOLUS
1000.0000 mL | Freq: Once | INTRAVENOUS | Status: AC
  Administered 2022-02-01: 1000 mL via INTRAVENOUS

## 2022-02-01 MED ORDER — ONDANSETRON 4 MG PO TBDP: 4.0000 mg | ORAL_TABLET | Freq: Four times a day (QID) | ORAL | Status: DC | PRN

## 2022-02-01 MED ORDER — MIDAZOLAM HCL 2 MG/2ML IJ SOLN
INTRAMUSCULAR | Status: AC
  Filled 2022-02-01: qty 2

## 2022-02-01 MED ORDER — MORPHINE SULFATE (PF) 2 MG/ML IV SOLN: 1.0000 mg | INTRAVENOUS | Status: DC | PRN

## 2022-02-01 MED ORDER — MIDAZOLAM HCL 5 MG/5ML IJ SOLN
INTRAMUSCULAR | Status: DC | PRN
  Administered 2022-02-01: 2 mg via INTRAVENOUS

## 2022-02-01 MED ORDER — MORPHINE SULFATE (PF) 4 MG/ML IV SOLN
4.0000 mg | Freq: Once | INTRAVENOUS | Status: AC
  Administered 2022-02-01: 4 mg via INTRAVENOUS
  Filled 2022-02-01: qty 1

## 2022-02-01 MED ORDER — METHOCARBAMOL 1000 MG/10ML IJ SOLN: 500.0000 mg | Freq: Four times a day (QID) | INTRAVENOUS | Status: DC | PRN

## 2022-02-01 MED ORDER — PROPOFOL 10 MG/ML IV BOLUS
INTRAVENOUS | Status: DC | PRN
  Administered 2022-02-01: 200 mg via INTRAVENOUS

## 2022-02-01 MED ORDER — LACTATED RINGERS IV SOLN: INTRAVENOUS | Status: DC | PRN

## 2022-02-01 MED ORDER — ONDANSETRON HCL 4 MG/2ML IJ SOLN: 4.0000 mg | Freq: Four times a day (QID) | INTRAMUSCULAR | Status: DC | PRN

## 2022-02-01 MED ORDER — SIMETHICONE 80 MG PO CHEW: 40.0000 mg | CHEWABLE_TABLET | Freq: Four times a day (QID) | ORAL | Status: DC | PRN

## 2022-02-01 MED ORDER — OXYCODONE HCL 5 MG PO TABS: 5.0000 mg | ORAL_TABLET | Freq: Once | ORAL | Status: DC | PRN

## 2022-02-01 MED ORDER — HYDROMORPHONE HCL 1 MG/ML IJ SOLN: 0.2500 mg | INTRAMUSCULAR | Status: DC | PRN

## 2022-02-01 MED ORDER — FENTANYL CITRATE (PF) 100 MCG/2ML IJ SOLN
INTRAMUSCULAR | Status: AC
  Filled 2022-02-01: qty 2

## 2022-02-01 MED ORDER — OXYCODONE HCL 5 MG/5ML PO SOLN: 5.0000 mg | Freq: Once | ORAL | Status: DC | PRN

## 2022-02-01 MED ORDER — LIDOCAINE HCL (PF) 2 % IJ SOLN
INTRAMUSCULAR | Status: AC
  Filled 2022-02-01: qty 5

## 2022-02-01 MED ORDER — MELATONIN 3 MG PO TABS: 3.0000 mg | ORAL_TABLET | Freq: Every evening | ORAL | Status: DC | PRN

## 2022-02-01 MED ORDER — DEXAMETHASONE SODIUM PHOSPHATE 10 MG/ML IJ SOLN
INTRAMUSCULAR | Status: DC | PRN
  Administered 2022-02-01: 10 mg via INTRAVENOUS

## 2022-02-01 MED ORDER — ACETAMINOPHEN 325 MG PO TABS
650.0000 mg | ORAL_TABLET | Freq: Once | ORAL | Status: AC
  Administered 2022-02-01: 650 mg via ORAL
  Filled 2022-02-01: qty 2

## 2022-02-01 MED ORDER — SUCCINYLCHOLINE CHLORIDE 200 MG/10ML IV SOSY
PREFILLED_SYRINGE | INTRAVENOUS | Status: DC | PRN
  Administered 2022-02-01: 100 mg via INTRAVENOUS

## 2022-02-01 MED ORDER — SODIUM CHLORIDE 0.9 % IV SOLN: 2.0000 g | Freq: Once | INTRAVENOUS | Status: DC

## 2022-02-01 MED ORDER — METRONIDAZOLE 500 MG/100ML IV SOLN
500.0000 mg | Freq: Once | INTRAVENOUS | Status: DC
Start: 2022-02-01 — End: 2022-02-01

## 2022-02-01 MED ORDER — 0.9 % SODIUM CHLORIDE (POUR BTL) OPTIME
TOPICAL | Status: DC | PRN
  Administered 2022-02-01: 1000 mL

## 2022-02-01 MED ORDER — BUPIVACAINE-EPINEPHRINE 0.25% -1:200000 IJ SOLN
INTRAMUSCULAR | Status: AC
  Filled 2022-02-01: qty 1

## 2022-02-01 MED ORDER — ONDANSETRON HCL 4 MG/2ML IJ SOLN
4.0000 mg | Freq: Once | INTRAMUSCULAR | Status: AC
  Administered 2022-02-01: 4 mg via INTRAVENOUS
  Filled 2022-02-01: qty 2

## 2022-02-01 MED ORDER — DIPHENHYDRAMINE HCL 25 MG PO CAPS: 25.0000 mg | ORAL_CAPSULE | Freq: Four times a day (QID) | ORAL | Status: DC | PRN

## 2022-02-01 MED ORDER — ONDANSETRON HCL 4 MG/2ML IJ SOLN
INTRAMUSCULAR | Status: DC | PRN
  Administered 2022-02-01: 4 mg via INTRAVENOUS

## 2022-02-01 MED ORDER — DEXAMETHASONE SODIUM PHOSPHATE 10 MG/ML IJ SOLN
INTRAMUSCULAR | Status: AC
  Filled 2022-02-01: qty 1

## 2022-02-01 MED ORDER — HYDROMORPHONE HCL 1 MG/ML IJ SOLN
0.5000 mg | INTRAMUSCULAR | Status: DC | PRN
  Administered 2022-02-01: 0.5 mg via INTRAVENOUS
  Filled 2022-02-01: qty 0.5

## 2022-02-01 MED ORDER — IOHEXOL 300 MG/ML  SOLN
100.0000 mL | Freq: Once | INTRAMUSCULAR | Status: AC | PRN
  Administered 2022-02-01: 100 mL via INTRAVENOUS

## 2022-02-01 MED ORDER — METRONIDAZOLE 500 MG/100ML IV SOLN
500.0000 mg | Freq: Once | INTRAVENOUS | Status: AC
  Administered 2022-02-01: 500 mg via INTRAVENOUS
  Filled 2022-02-01 (×2): qty 100

## 2022-02-01 MED ORDER — AMISULPRIDE (ANTIEMETIC) 5 MG/2ML IV SOLN: 10.0000 mg | Freq: Once | INTRAVENOUS | Status: DC | PRN

## 2022-02-01 MED ORDER — ONDANSETRON HCL 4 MG/2ML IJ SOLN
INTRAMUSCULAR | Status: AC
  Filled 2022-02-01: qty 2

## 2022-02-01 MED ORDER — FENTANYL CITRATE (PF) 100 MCG/2ML IJ SOLN
INTRAMUSCULAR | Status: DC | PRN
  Administered 2022-02-01 (×4): 50 ug via INTRAVENOUS

## 2022-02-01 MED ORDER — ROCURONIUM BROMIDE 10 MG/ML (PF) SYRINGE
PREFILLED_SYRINGE | INTRAVENOUS | Status: DC | PRN
  Administered 2022-02-01: 60 mg via INTRAVENOUS

## 2022-02-01 MED ORDER — SODIUM CHLORIDE 0.9 % IV SOLN: INTRAVENOUS | Status: DC

## 2022-02-01 MED ORDER — LIDOCAINE 2% (20 MG/ML) 5 ML SYRINGE
INTRAMUSCULAR | Status: DC | PRN
  Administered 2022-02-01: 100 mg via INTRAVENOUS

## 2022-02-01 MED ORDER — ACETAMINOPHEN 500 MG PO TABS
1000.0000 mg | ORAL_TABLET | Freq: Four times a day (QID) | ORAL | Status: DC
  Administered 2022-02-02 (×2): 1000 mg via ORAL
  Filled 2022-02-01 (×2): qty 2

## 2022-02-01 MED ORDER — BUPIVACAINE-EPINEPHRINE 0.25% -1:200000 IJ SOLN
INTRAMUSCULAR | Status: DC | PRN
  Administered 2022-02-01: 30 mL

## 2022-02-01 MED ORDER — SODIUM CHLORIDE 0.9 % IV SOLN
2.0000 g | Freq: Once | INTRAVENOUS | Status: AC
  Administered 2022-02-01: 2 g via INTRAVENOUS
  Filled 2022-02-01: qty 20

## 2022-02-01 MED ORDER — MEPERIDINE HCL 50 MG/ML IJ SOLN: 6.2500 mg | INTRAMUSCULAR | Status: DC | PRN

## 2022-02-01 MED ORDER — OXYCODONE HCL 5 MG PO TABS
5.0000 mg | ORAL_TABLET | ORAL | Status: DC | PRN
  Administered 2022-02-02 (×2): 10 mg via ORAL
  Filled 2022-02-01 (×2): qty 2

## 2022-02-01 MED ORDER — ENOXAPARIN SODIUM 40 MG/0.4ML IJ SOSY
40.0000 mg | PREFILLED_SYRINGE | INTRAMUSCULAR | Status: DC
Start: 2022-02-02 — End: 2022-02-02

## 2022-02-01 MED ORDER — PROPOFOL 10 MG/ML IV BOLUS
INTRAVENOUS | Status: AC
  Filled 2022-02-01: qty 20

## 2022-02-01 MED ORDER — HYDROMORPHONE HCL 1 MG/ML IJ SOLN
1.0000 mg | Freq: Once | INTRAMUSCULAR | Status: AC
  Administered 2022-02-01: 1 mg via INTRAVENOUS
  Filled 2022-02-01: qty 1

## 2022-02-01 MED ORDER — BUPIVACAINE-EPINEPHRINE (PF) 0.25% -1:200000 IJ SOLN
INTRAMUSCULAR | Status: AC
  Filled 2022-02-01: qty 30

## 2022-02-01 MED ORDER — KETOROLAC TROMETHAMINE 30 MG/ML IJ SOLN
30.0000 mg | Freq: Three times a day (TID) | INTRAMUSCULAR | Status: DC
  Filled 2022-02-01: qty 1

## 2022-02-01 MED ORDER — LACTATED RINGERS IR SOLN
Status: DC | PRN
  Administered 2022-02-01: 1000 mL

## 2022-02-01 MED ORDER — SUGAMMADEX SODIUM 200 MG/2ML IV SOLN
INTRAVENOUS | Status: DC | PRN
  Administered 2022-02-01: 200 mg via INTRAVENOUS

## 2022-02-01 MED ORDER — POLYETHYLENE GLYCOL 3350 17 G PO PACK
17.0000 g | PACK | Freq: Every day | ORAL | Status: DC
Start: 2022-02-02 — End: 2022-02-02
  Administered 2022-02-02: 17 g via ORAL
  Filled 2022-02-01: qty 1

## 2022-02-01 MED ORDER — METOPROLOL TARTRATE 5 MG/5ML IV SOLN: 5.0000 mg | Freq: Four times a day (QID) | INTRAVENOUS | Status: DC | PRN

## 2022-02-01 MED ORDER — KCL IN DEXTROSE-NACL 20-5-0.9 MEQ/L-%-% IV SOLN
INTRAVENOUS | Status: DC
  Filled 2022-02-01: qty 1000

## 2022-02-01 SURGICAL SUPPLY — 57 items
ADH SKN CLS APL DERMABOND .7 (GAUZE/BANDAGES/DRESSINGS) ×1
APL PRP STRL LF DISP 70% ISPRP (MISCELLANEOUS) ×1
APPLIER CLIP 5 13 M/L LIGAMAX5 (MISCELLANEOUS)
APPLIER CLIP ROT 10 11.4 M/L (STAPLE)
APR CLP MED LRG 11.4X10 (STAPLE)
APR CLP MED LRG 5 ANG JAW (MISCELLANEOUS)
BAG COUNTER SPONGE SURGICOUNT (BAG) IMPLANT
BAG RETRIEVAL 10 (BASKET) ×1
BAG SPNG CNTER NS LX DISP (BAG)
CABLE HIGH FREQUENCY MONO STRZ (ELECTRODE) ×1 IMPLANT
CHLORAPREP W/TINT 26 (MISCELLANEOUS) ×2 IMPLANT
CLIP APPLIE 5 13 M/L LIGAMAX5 (MISCELLANEOUS) IMPLANT
CLIP APPLIE ROT 10 11.4 M/L (STAPLE) IMPLANT
COVER SURGICAL LIGHT HANDLE (MISCELLANEOUS) ×2 IMPLANT
CUTTER FLEX LINEAR 45M (STAPLE) ×1 IMPLANT
DERMABOND ADVANCED (GAUZE/BANDAGES/DRESSINGS) ×1
DERMABOND ADVANCED .7 DNX12 (GAUZE/BANDAGES/DRESSINGS) ×1 IMPLANT
DRAIN CHANNEL 19F RND (DRAIN) IMPLANT
ELECT REM PT RETURN 15FT ADLT (MISCELLANEOUS) ×2 IMPLANT
ENDOLOOP SUT PDS II  0 18 (SUTURE)
ENDOLOOP SUT PDS II 0 18 (SUTURE) IMPLANT
EVACUATOR SILICONE 100CC (DRAIN) IMPLANT
GLOVE BIO SURGEON STRL SZ 6 (GLOVE) ×2 IMPLANT
GLOVE INDICATOR 6.5 STRL GRN (GLOVE) ×2 IMPLANT
GLOVE SS BIOGEL STRL SZ 6 (GLOVE) ×1 IMPLANT
GLOVE SUPERSENSE BIOGEL SZ 6 (GLOVE) ×1
GOWN STRL REUS W/ TWL LRG LVL3 (GOWN DISPOSABLE) ×1 IMPLANT
GOWN STRL REUS W/ TWL XL LVL3 (GOWN DISPOSABLE) IMPLANT
GOWN STRL REUS W/TWL LRG LVL3 (GOWN DISPOSABLE) ×6
GOWN STRL REUS W/TWL XL LVL3 (GOWN DISPOSABLE)
GRASPER SUT TROCAR 14GX15 (MISCELLANEOUS) ×1 IMPLANT
IRRIG SUCT STRYKERFLOW 2 WTIP (MISCELLANEOUS) ×2
IRRIGATION SUCT STRKRFLW 2 WTP (MISCELLANEOUS) ×1 IMPLANT
KIT BASIN OR (CUSTOM PROCEDURE TRAY) ×2 IMPLANT
KIT TURNOVER KIT A (KITS) ×1 IMPLANT
NDL INSUFFLATION 14GA 120MM (NEEDLE) ×1 IMPLANT
NEEDLE INSUFFLATION 14GA 120MM (NEEDLE) ×2 IMPLANT
RELOAD 45 VASCULAR/THIN (ENDOMECHANICALS) ×4 IMPLANT
RELOAD STAPLE 45 2.5 WHT GRN (ENDOMECHANICALS) IMPLANT
RELOAD STAPLE 45 3.5 BLU ETS (ENDOMECHANICALS) IMPLANT
RELOAD STAPLE TA45 3.5 REG BLU (ENDOMECHANICALS) IMPLANT
SCISSORS LAP 5X35 DISP (ENDOMECHANICALS) ×1 IMPLANT
SET TUBE SMOKE EVAC HIGH FLOW (TUBING) ×2 IMPLANT
SHEARS HARMONIC ACE PLUS 36CM (ENDOMECHANICALS) ×2 IMPLANT
SLEEVE Z-THREAD 5X100MM (TROCAR) ×2 IMPLANT
SPIKE FLUID TRANSFER (MISCELLANEOUS) ×1 IMPLANT
SUT ETHILON 2 0 PS N (SUTURE) IMPLANT
SUT MNCRL AB 4-0 PS2 18 (SUTURE) ×2 IMPLANT
SYS BAG RETRIEVAL 10MM (BASKET) ×1
SYSTEM BAG RETRIEVAL 10MM (BASKET) ×1 IMPLANT
TOWEL OR 17X26 10 PK STRL BLUE (TOWEL DISPOSABLE) ×2 IMPLANT
TOWEL OR NON WOVEN STRL DISP B (DISPOSABLE) ×2 IMPLANT
TRAY FOLEY MTR SLVR 14FR STAT (SET/KITS/TRAYS/PACK) IMPLANT
TRAY FOLEY MTR SLVR 16FR STAT (SET/KITS/TRAYS/PACK) ×1 IMPLANT
TRAY LAPAROSCOPIC (CUSTOM PROCEDURE TRAY) ×2 IMPLANT
TROCAR KII 12X100 BLADELESS (ENDOMECHANICALS) ×2 IMPLANT
TROCAR Z-THREAD OPTICAL 5X100M (TROCAR) ×2 IMPLANT

## 2022-02-01 NOTE — Discharge Instructions (Signed)
It was a pleasure caring for you today in the emergency department. ° °Please return to the emergency department for any worsening or worrisome symptoms. ° ° °

## 2022-02-01 NOTE — ED Triage Notes (Signed)
Pt arrives pov, slow gait, c/o lower abdominal pain, and right lower back pain and left thigh pain starting yesterday afternoon. Sent to ED by pcp. Denies n/v

## 2022-02-01 NOTE — Interval H&P Note (Signed)
History and Physical Interval Note:  02/01/2022 5:32 PM  Geoffrey Henderson  has presented today for surgery, with the diagnosis of appendicitis.  The various methods of treatment have been discussed with the patient and family. After consideration of risks, benefits and other options for treatment, the patient has consented to  Procedure(s): APPENDECTOMY LAPAROSCOPIC (N/A) as a surgical intervention.  The patient's history has been reviewed, patient examined, no change in status, stable for surgery.  I have reviewed the patient's chart and labs.  Questions were answered to the patient's satisfaction.     Jazari Ober Lollie Sails

## 2022-02-01 NOTE — ED Notes (Signed)
Report given to [facility] ED Charge nurse and Care link

## 2022-02-01 NOTE — Transfer of Care (Signed)
Immediate Anesthesia Transfer of Care Note  Patient: Geoffrey Henderson  Procedure(s) Performed: Procedure(s): APPENDECTOMY LAPAROSCOPIC (N/A)  Patient Location: PACU  Anesthesia Type:General  Level of Consciousness: Alert, Awake, Oriented  Airway & Oxygen Therapy: Patient Spontanous Breathing  Post-op Assessment: Report given to RN  Post vital signs: Reviewed and stable  Last Vitals:  Vitals:   02/01/22 1630 02/01/22 1711  BP: 138/81 117/77  Pulse: 84 90  Resp:  16  Temp:  37.6 C  SpO2: [id number]    Complications: No apparent anesthesia complications

## 2022-02-01 NOTE — ED Provider Notes (Signed)
[facility] EMERGENCY DEPARTMENT Provider Note   CSN: [id number] Arrival date & time: 02/01/22  1042     History  Chief Complaint  Patient presents with   Abdominal Pain    Geoffrey Henderson is a 51 y.o. male.  Patient as above with significant medical history as below, including hernia repair who presents to the ED with complaint of right lower quad abdominal pain.  Onset around 12-18 hours ago.  Pain localized to the right lower quadrant, has spread to the left lower quadrant into the right flank.  Poor PO intake last 24 hours, no PO intake today, poor appetite.  He has nausea without vomiting.  Subjective fever.  No urinary changes.  No change in bowel function.  No pain in his testicles or scrotum.  No chest pain or dyspnea.  No trauma.  No medications prior to arrival.      History reviewed. No pertinent past medical history.  Past Surgical History:  Procedure Laterality Date   HERNIA REPAIR       The history is provided by the patient. No language interpreter was used.  Abdominal Pain Associated symptoms: fever and nausea   Associated symptoms: no chest pain, no chills, no cough, no hematuria, no shortness of breath and no vomiting       Home Medications Prior to Admission medications   Medication Sig Start Date End Date Taking? Authorizing Provider  fluticasone (FLONASE) 50 MCG/ACT nasal spray Place 2 sprays into both nostrils daily as needed for allergies. 12/29/13  Yes [provider]  LUMIGAN 0.01 % SOLN Place 1 drop into both eyes at bedtime.   Yes [provider]  montelukast (SINGULAIR) 10 MG tablet Take 10 mg by mouth daily as needed ("for allergies").   Yes [provider]  naproxen sodium (ALEVE) 220 MG tablet Take 220-440 mg by mouth 2 (two) times daily as needed (for pain).   Yes [provider]  ZYRTEC ALLERGY 10 MG tablet Take 10 mg by mouth daily as needed for allergies or rhinitis.   Yes [provider]   cephALEXin (KEFLEX) 500 MG capsule Take 1 capsule (500 mg total) by mouth 2 (two) times daily. Patient not taking: Reported on 02/01/2022 11/17/14   Lenn Sink, DPM  cyclobenzaprine (FLEXERIL) 10 MG tablet Take 1 tablet (10 mg total) by mouth 2 (two) times daily as needed for muscle spasms. Patient not taking: Reported on 02/01/2022 08/06/17   Janne Napoleon, NP  HYDROcodone-acetaminophen [facility]) 10-325 MG per tablet Take 1 tablet by mouth every 8 (eight) hours as needed. Patient not taking: Reported on 02/01/2022 11/17/14   Lenn Sink, DPM  naproxen (NAPROSYN) 500 MG tablet Take 1 tablet (500 mg total) by mouth 2 (two) times daily. Patient not taking: Reported on 02/01/2022 08/06/17   Janne Napoleon, NP  traMADol (ULTRAM) 50 MG tablet Take 1 tablet (50 mg total) by mouth every 6 (six) hours as needed. Patient not taking: Reported on 02/01/2022 01/15/14   Emilia Beck, PA-C      Allergies    Patient has no known allergies.    Review of Systems   Review of Systems  Constitutional:  Positive for appetite change and fever. Negative for chills.  HENT:  Negative for facial swelling and trouble swallowing.   Eyes:  Negative for photophobia and visual disturbance.  Respiratory:  Negative for cough and shortness of breath.   Cardiovascular:  Negative for chest pain and palpitations.  Gastrointestinal:  Positive for abdominal pain and nausea. Negative for vomiting.  Endocrine: Negative for polydipsia and polyuria.  Genitourinary:  Negative for difficulty urinating and hematuria.  Musculoskeletal:  Negative for gait problem and joint swelling.  Skin:  Negative for pallor and rash.  Neurological:  Negative for syncope and headaches.  Psychiatric/Behavioral:  Negative for agitation and confusion.    Physical Exam Updated Vital Signs BP 117/77   Pulse 90   Temp 99.6 F (37.6 C) (Oral)   Resp 16   Ht  (1.702 m)   Wt 93 kg   SpO2 99%   BMI 32.11 kg/m  Physical Exam Vitals and  nursing note reviewed.  Constitutional:      General: He is not in acute distress.    Appearance: He is well-developed. He is not ill-appearing.  HENT:     Head: Normocephalic and atraumatic.     Right Ear: External ear normal.     Left Ear: External ear normal.     Mouth/Throat:     Mouth: Mucous membranes are moist.  Eyes:     General: No scleral icterus. Cardiovascular:     Rate and Rhythm: Normal rate and regular rhythm.     Pulses: Normal pulses.     Heart sounds: Normal heart sounds.  Pulmonary:     Effort: Pulmonary effort is normal. No respiratory distress.     Breath sounds: Normal breath sounds.  Abdominal:     General: Abdomen is flat.     Palpations: Abdomen is soft.     Tenderness: There is abdominal tenderness in the right lower quadrant. There is guarding. There is no rebound. Positive signs include Rovsing's sign, McBurney's sign and obturator sign.  Musculoskeletal:        General: Normal range of motion.     Cervical back: Normal range of motion.     Right lower leg: No edema.     Left lower leg: No edema.  Skin:    General: Skin is warm and dry.     Capillary Refill: Capillary refill takes less than 2 seconds.  Neurological:     Mental Status: He is alert and oriented to person, place, and time.  Psychiatric:        Mood and Affect: Mood normal.        Behavior: Behavior normal.    ED Results / Procedures / Treatments   Labs (all labs ordered are listed, but only abnormal results are displayed) Labs Reviewed  COMPREHENSIVE METABOLIC PANEL - Abnormal; Notable for the following components:      Result Value   Sodium 134 (*)    Glucose, Bld 108 (*)    Alkaline Phosphatase 31 (*)    All other components within normal limits  RESP PANEL BY RT-PCR (FLU A&B, COVID) ARPGX2  CBC WITH DIFFERENTIAL/PLATELET  LIPASE, BLOOD  URINALYSIS, ROUTINE W REFLEX MICROSCOPIC  LACTIC ACID, PLASMA  LACTIC ACID, PLASMA  HIV ANTIBODY (ROUTINE TESTING W REFLEX)     EKG None  Radiology CT ABDOMEN PELVIS W CONTRAST  Result Date: 02/01/2022 CLINICAL DATA:  Lower abdominal pain, right lower back and left thigh pain since yesterday afternoon EXAM: CT ABDOMEN AND PELVIS WITH CONTRAST TECHNIQUE: Multidetector CT imaging of the abdomen and pelvis was performed using the standard protocol following bolus administration of intravenous contrast. RADIATION DOSE REDUCTION: This exam was performed according to the departmental dose-optimization program which includes automated exposure control, adjustment of the mA and/or kV according to patient size and/or use  of iterative reconstruction technique. CONTRAST:  OMNIPAQUE IOHEXOL 300 MG/ML  SOLN COMPARISON:  CT abdomen/pelvis 05/26/2001 FINDINGS: Lower chest: The lung bases are clear. The imaged heart is unremarkable. Hepatobiliary: The liver and gallbladder are unremarkable. There is no biliary ductal dilatation. Pancreas: Unremarkable. Spleen: Unremarkable. Adrenals/Urinary Tract: The adrenals are unremarkable. The kidneys are unremarkable, with no focal lesion, stone, hydronephrosis, or hydroureter. Bladder is unremarkable. Stomach/Bowel: The stomach is unremarkable there is no evidence of bowel obstruction. The appendix is prominent in caliber measuring up to 9 mm distally but without appreciable wall thickening, and no significant surrounding inflammatory change. There is no surrounding free fluid or free air. There is no other abnormal bowel wall thickening or inflammatory change. Vascular/Lymphatic: Abdominal aorta is normal in course and caliber. The major branch vessels are patent. The main portal and splenic veins are patent. There is no abdominal or pelvic lymphadenopathy. Reproductive: The prostate and seminal vesicles are unremarkable. Other: There is no ascites or free air. Musculoskeletal: There is no acute osseous abnormality or suspicious osseous lesion. IMPRESSION: 1. Prominent caliber appendix measuring up  to 9 mm distally but without appreciable wall thickening or significant surrounding inflammatory change. Findings are felt unlikely to reflect early acute appendicitis, but recommend correlation with physical exam and lab values. 2. No other evidence of acute pathology in the abdomen or pelvis. Electronically Signed   By: Lesia Hausen M.D.   On: 02/01/2022 13:03    Procedures .Critical Care Performed by: Sloan Leiter, DO Authorized by: Sloan Leiter, DO   Critical care provider statement:    Critical care time (minutes):  30   Critical care time was exclusive of:  Separately billable procedures and treating other patients   Critical care was necessary to treat or prevent imminent or life-threatening deterioration of the following conditions: intractable pain.   Critical care was time spent personally by me on the following activities:  Development of treatment plan with patient or surrogate, discussions with consultants, evaluation of patient's response to treatment, examination of patient, ordering and review of laboratory studies, ordering and review of radiographic studies, ordering and performing treatments and interventions, pulse oximetry, re-evaluation of patient's condition, review of old charts and obtaining history from patient or surrogate   Care discussed with: accepting provider at another facility      Medications Ordered in ED Medications  acetaminophen (TYLENOL) tablet 1,000 mg ( Oral Automatically Held 02/09/22 1800)  enoxaparin (LOVENOX) injection 40 mg ( Subcutaneous Automatically Held 02/10/22 2200)  dextrose 5 % and 0.9 % NaCl with KCl 20 mEq/L infusion (has no administration in time range)  cefTRIAXone (ROCEPHIN) 2 g in sodium chloride 0.9 % 100 mL IVPB ( Intravenous MAR Hold 02/01/22 1703)    And  metroNIDAZOLE (FLAGYL) IVPB 500 mg ( Intravenous MAR Hold 02/01/22 1703)  ketorolac (TORADOL) 30 MG/ML injection 30 mg ( Intravenous Automatically Held 02/03/22 0900)  morphine  (PF) 2 MG/ML injection 1-4 mg ( Intravenous MAR Hold 02/01/22 1703)  oxyCODONE (Oxy IR/ROXICODONE) immediate release tablet 5-10 mg ( Oral MAR Hold 02/01/22 1703)  melatonin tablet 3 mg ( Oral MAR Hold 02/01/22 1703)  diphenhydrAMINE (BENADRYL) capsule 25 mg ( Oral MAR Hold 02/01/22 1703)    Or  diphenhydrAMINE (BENADRYL) injection 25 mg ( Intravenous MAR Hold 02/01/22 1703)  ondansetron (ZOFRAN-ODT) disintegrating tablet 4 mg ( Oral MAR Hold 02/01/22 1703)    Or  ondansetron (ZOFRAN) injection 4 mg ( Intravenous MAR Hold 02/01/22 1703)  simethicone (MYLICON) chewable  tablet 40 mg ( Oral MAR Hold 02/01/22 1703)  metoprolol tartrate (LOPRESSOR) injection 5 mg ( Intravenous MAR Hold 02/01/22 1703)  polyethylene glycol (MIRALAX / GLYCOLAX) packet 17 g ( Oral Automatically Held 02/10/22 1000)  lactated ringers irrigation solution (1,000 mLs Irrigation Given 02/01/22 1705)  0.9 % irrigation (POUR BTL) (1,000 mLs Irrigation Given 02/01/22 1706)  sodium chloride 0.9 % bolus 1,000 mL (0 mLs Intravenous Stopped 02/01/22 1331)  morphine (PF) 4 MG/ML injection 4 mg (4 mg Intravenous Given 02/01/22 1222)  ondansetron (ZOFRAN) injection 4 mg (4 mg Intravenous Given 02/01/22 1222)  acetaminophen (TYLENOL) tablet 650 mg (650 mg Oral Given 02/01/22 1223)  iohexol (OMNIPAQUE) 300 MG/ML solution 100 mL (100 mLs Intravenous Contrast Given 02/01/22 1236)  HYDROmorphone (DILAUDID) injection 1 mg (1 mg Intravenous Given 02/01/22 1409)    ED Course/ Medical Decision Making/ A&P Clinical Course as of 02/01/22 1800  Tue Feb 01, 2022  1537 Clinical exam is concerning for appendicitis, his CT is equivocal, spoke with Tresa Endo with [facility] who d/w Dr Magnus Ivan, recommends send to [facility] for eval for surgery. Pt is agreeable. Will send ED to ED to [facility] [[facility]]    Clinical Course User Index [[facility]] Sloan Leiter, DO                           Medical Decision Making Amount and/or Complexity of Data Reviewed Labs: ordered. Radiology:  ordered.  Risk OTC drugs. Prescription drug management. Decision regarding hospitalization.    CC: Right lower quadrant abdominal pain  This patient presents to the Emergency Department for the above complaint. This involves an extensive number of treatment options and is a complaint that carries with it a high risk of complications and morbidity. Vital signs were reviewed. Serious etiologies considered.  Differential diagnosis includes but is not exclusive to acute appendicitis, renal colic, testicular torsion, urinary tract infection, prostatitis,  diverticulitis, small bowel obstruction, colitis, abdominal aortic aneurysm, gastroenteritis, constipation etc.   No testicular pain or swelling  Record review:  Previous records obtained and reviewed  Prior ED visits, prior labs and imaging  Additional history obtained from N/A  Medical and surgical history as noted above.   Work up as above, notable for:  Labs & imaging results that were available during my care of the patient were visualized by me and considered in my medical decision making.   I ordered imaging studies which included CT abdomen pelvis with IV contrast. I visualized the imaging, interpreted images, and I agree with radiologist interpretation.  appendix is enlarged, no obvious inflammatory changes  Cardiac monitoring reviewed and interpreted personally which shows NSR  Labs reviewed and are stable.  No leukocytosis.  No lactic acidosis.  Urinalysis no evidence acute infection.  Viral panel negative.  Management: Patient given multiple doses of IV analgesics, IV fluids  Reassessment:  Pain mildly improved but still persists. Abd remains ttp RLQ. Not peritoneal.   Admission was considered.   Alvarado score is 6  Patient with enlarged appendix on CT imaging, right lower quadrant pain, positive McBurney sign, positive Rovsing's.  Discussed with general surgeon regarding possible early appendicitis. Will send to  [facility] ED to ED for gen surg eval, spoke with Dr Freida Busman at [facility] who accepts ED transfer. Pt is agreeable. He is HDS and stable for transfer  Keep NPO, last PO intake was yestd PM  Recommend ED team notify gen surg upon pt arrival to [facility] for eval.  Social determinants of health include -  Social History   Socioeconomic History   Marital status: Single    Spouse name: Not on file   Number of children: Not on file   Years of education: Not on file   Highest education level: Not on file  Occupational History   Not on file  Tobacco Use   Smoking status: Former    Types: Cigarettes    Quit date: 06/06/2011    Years since quitting: 10.6   Smokeless tobacco: Not on file  Substance and Sexual Activity   Alcohol use: Yes    Comment: occasional   Drug use: Not Currently    Comment: hookah   Sexual activity: Yes    Birth control/protection: Condom  Other Topics Concern   Not on file  Social History Narrative   Not on file   Social Determinants of Health   Financial Resource Strain: Not on file  Food Insecurity: Not on file  Transportation Needs: Not on file  Physical Activity: Not on file  Stress: Not on file  Social Connections: Not on file  Intimate Partner Violence: Not on file      This chart was dictated using voice recognition software.  Despite best efforts to proofread,  errors can occur which can change the documentation meaning.         Final Clinical Impression(s) / ED Diagnoses Final diagnoses:  RLQ abdominal pain  Symptoms of appendicitis    Rx / DC Orders ED Discharge Orders     None         Sloan LeiterGray, Ethanjames Fontenot A, DO 02/01/22 1800

## 2022-02-01 NOTE — H&P (Signed)
Geoffrey Henderson [birth year]  [id number].    Chief Complaint/Reason for Consult: RLQ abdominal pain  HPI:  This is a 51 yo male with no significant PMH who began having RLQ abdominal pain last night.  This has spread to his LLQ and to his right flank at times.  He had some nausea but no vomiting.  He had subjective fevers at home, but in the [facility] ED had a temp of 101.7.  He denies any dysuria.  He was unable to sleepy last night due to pain  His last BM was last night.  He takes Linzess to move his bowels 1-2 times a day.  He presented to the [facility] ED this morning where he has been found to have normal labs and a CT scan that reveals a prominent caliber appendix measuring 29mm distally but no wall thickening or inflammatory changes.  These findings are not felt to reflect early appendicitis.  Due to location though, we have been called to see and assess the patient.  ROS: ROS: Please see HPI  History reviewed. No pertinent family history.  History reviewed. No pertinent past medical history.  Past Surgical History:  Procedure Laterality Date   HERNIA REPAIR      Social History:  reports that he quit smoking about 10 years ago. His smoking use included cigarettes. He does not have any smokeless tobacco history on file. He reports current alcohol use. He reports that he does not currently use drugs.  Allergies: No Known Allergies  (Not in a hospital admission)    Physical Exam: Blood pressure 106/66, pulse 80, temperature 99.5 F (37.5 C), resp. rate 18, height 5\' 7"  (1.702 m), weight 93 kg, SpO2 97 %. General: pleasant, WD, WN male who is laying in bed in NAD HEENT: head is normocephalic, atraumatic.  Sclera are noninjected.  PERRL.  Ears and nose without any masses or lesions.  Mouth is pink and moist Heart: regular, rate, and rhythm.  Normal s1,s2. No obvious murmurs, gallops, or rubs noted.  Palpable radial and pedal pulses bilaterally Lungs: CTAB, no wheezes, rhonchi, or rales  noted.  Respiratory effort nonlabored Abd: soft, tender in RLQ and medial towards his suprapubic space, co/w where his appendix is on his CT scan, + guarding, but no overt peritoneal signs, ND, +BS, no masses, hernias, or organomegaly.  Umbilical scar from hernia repair Psych: A&Ox3 with an appropriate affect.   Results for orders placed or performed during the hospital encounter of 02/01/22 (from the past 48 hour(s))  Urinalysis, Routine w reflex microscopic Urine, Clean Catch     Status: None   Collection Time: 02/01/22 10:57 AM  Result Value Ref Range   Color, Urine YELLOW YELLOW   APPearance CLEAR CLEAR   Specific Gravity, Urine 1.010 1.005 - 1.030   pH 6.5 5.0 - 8.0   Glucose, UA NEGATIVE NEGATIVE mg/dL   Hgb urine dipstick NEGATIVE NEGATIVE   Bilirubin Urine NEGATIVE NEGATIVE   Ketones, ur NEGATIVE NEGATIVE mg/dL   Protein, ur NEGATIVE NEGATIVE mg/dL   Nitrite NEGATIVE NEGATIVE   Leukocytes,Ua NEGATIVE NEGATIVE    Comment: Microscopic not done on urines with negative protein, blood, leukocytes, nitrite, or glucose < 500 mg/dL. Performed at [facility], [address] [place], [place]   CBC with Differential     Status: None   Collection Time: 02/01/22 11:48 AM  Result Value Ref Range   WBC 7.8 4.0 - 10.5 K/uL   RBC  5.05 4.22 - 5.81 MIL/uL   Hemoglobin 16.4 13.0 - 17.0 g/dL   HCT 19.3 79.0 - 24.0 %   MCV 91.7 80.0 - 100.0 fL   MCH 32.5 26.0 - 34.0 pg   MCHC 35.4 30.0 - 36.0 g/dL   RDW 97.3 53.2 - 99.2 %   Platelets 201 150 - 400 K/uL   nRBC 0.0 0.0 - 0.2 %   Neutrophils Relative % 82 %   Neutro Abs 6.4 1.7 - 7.7 K/uL   Lymphocytes Relative 13 %   Lymphs Abs 1.0 0.7 - 4.0 K/uL   Monocytes Relative 4 %   Monocytes Absolute 0.3 0.1 - 1.0 K/uL   Eosinophils Relative 1 %   Eosinophils Absolute 0.1 0.0 - 0.5 K/uL   Basophils Relative 0 %   Basophils Absolute 0.0 0.0 - 0.1 K/uL   Immature Granulocytes 0 %   Abs Immature Granulocytes 0.03 0.00 - 0.07  K/uL    Comment: Performed at [facility], [address] [place], [place] [address]  Comprehensive metabolic panel     Status: Abnormal   Collection Time: 02/01/22 11:48 AM  Result Value Ref Range   Sodium 134 (L) 135 - 145 mmol/L   Potassium 3.8 3.5 - 5.1 mmol/L   Chloride 102 98 - 111 mmol/L   CO2 25 22 - 32 mmol/L   Glucose, Bld 108 (H) 70 - 99 mg/dL    Comment: Glucose reference range applies only to samples taken after fasting for at least 8 hours.   BUN 11 6 - 20 mg/dL   Creatinine, Ser 4.19 0.61 - 1.24 mg/dL   Calcium 9.0 8.9 - 62.2 mg/dL   Total Protein 7.2 6.5 - 8.1 g/dL   Albumin 4.2 3.5 - 5.0 g/dL   AST 23 15 - 41 U/L   ALT 19 0 - 44 U/L   Alkaline Phosphatase 31 (L) 38 - 126 U/L   Total Bilirubin 0.9 0.3 - 1.2 mg/dL   GFR, Estimated >29 >79 mL/min    Comment: (NOTE) Calculated using the CKD-EPI Creatinine Equation (2021)    Anion gap 7 5 - 15    Comment: Performed at [facility], [address] [place], [place] [address]  Lipase, blood     Status: None   Collection Time: 02/01/22 11:48 AM  Result Value Ref Range   Lipase 31 11 - 51 U/L    Comment: Performed at [facility], [address] [place], [place] [address]  Resp Panel by RT-PCR (Flu A&B, Covid) Anterior Nasal Swab     Status: None   Collection Time: 02/01/22 11:48 AM   Specimen: Anterior Nasal Swab  Result Value Ref Range   SARS Coronavirus 2 by RT PCR NEGATIVE NEGATIVE    Comment: (NOTE) SARS-CoV-2 target nucleic acids are NOT DETECTED.  The SARS-CoV-2 RNA is generally detectable in upper respiratory specimens during the acute phase of infection. The lowest concentration of SARS-CoV-2 viral copies this assay can detect is 138 copies/mL. A negative result does not preclude SARS-Cov-2 infection and should not be used as the sole basis for treatment or other patient management decisions. A negative result may occur with  improper specimen collection/handling,  submission of specimen other than nasopharyngeal swab, presence of viral mutation(s) within the areas targeted by this assay, and inadequate number of viral copies(<138 copies/mL). A negative result must be combined with clinical observations, patient history, and epidemiological information. The expected result is  Negative.  Fact Sheet for Patients:  BloggerCourse.com  Fact Sheet for Healthcare Providers:  SeriousBroker.it  This test is no t yet approved or cleared by the [place] FDA and  has been authorized for detection and/or diagnosis of SARS-CoV-2 by FDA under an Emergency Use Authorization (EUA). This EUA will remain  in effect (meaning this test can be used) for the duration of the COVID-19 declaration under Section 564(b)(1) of the Act, 21 U.S.C.section 360bbb-3(b)(1), unless the authorization is terminated  or revoked sooner.       Influenza A by PCR NEGATIVE NEGATIVE   Influenza B by PCR NEGATIVE NEGATIVE    Comment: (NOTE) The Xpert Xpress SARS-CoV-2/FLU/RSV plus assay is intended as an aid in the diagnosis of influenza from Nasopharyngeal swab specimens and should not be used as a sole basis for treatment. Nasal washings and aspirates are unacceptable for Xpert Xpress SARS-CoV-2/FLU/RSV testing.  Fact Sheet for Patients: BloggerCourse.com  Fact Sheet for Healthcare Providers: SeriousBroker.it  This test is not yet approved or cleared by the [place] FDA and has been authorized for detection and/or diagnosis of SARS-CoV-2 by FDA under an Emergency Use Authorization (EUA). This EUA will remain in effect (meaning this test can be used) for the duration of the COVID-19 declaration under Section 564(b)(1) of the Act, 21 U.S.C. section 360bbb-3(b)(1), unless the authorization is terminated or revoked.  Performed at [facility], [address] [place], [place] [address]   Lactic acid, plasma     Status: None   Collection Time: 02/01/22 12:28 PM  Result Value Ref Range   Lactic Acid, Venous 1.6 0.5 - 1.9 mmol/L    Comment: Performed at [facility], [address] [place], [place] [address]   CT ABDOMEN PELVIS W CONTRAST  Result Date: 02/01/2022 CLINICAL DATA:  Lower abdominal pain, right lower back and left thigh pain since yesterday afternoon EXAM: CT ABDOMEN AND PELVIS WITH CONTRAST TECHNIQUE: Multidetector CT imaging of the abdomen and pelvis was performed using the standard protocol following bolus administration of intravenous contrast. RADIATION DOSE REDUCTION: This exam was performed according to the departmental dose-optimization program which includes automated exposure control, adjustment of the mA and/or kV according to patient size and/or use of iterative reconstruction technique. CONTRAST:  OMNIPAQUE IOHEXOL 300 MG/ML  SOLN COMPARISON:  CT abdomen/pelvis 05/26/2001 FINDINGS: Lower chest: The lung bases are clear. The imaged heart is unremarkable. Hepatobiliary: The liver and gallbladder are unremarkable. There is no biliary ductal dilatation. Pancreas: Unremarkable. Spleen: Unremarkable. Adrenals/Urinary Tract: The adrenals are unremarkable. The kidneys are unremarkable, with no focal lesion, stone, hydronephrosis, or hydroureter. Bladder is unremarkable. Stomach/Bowel: The stomach is unremarkable there is no evidence of bowel obstruction. The appendix is prominent in caliber measuring up to 9 mm distally but without appreciable wall thickening, and no significant surrounding inflammatory change. There is no surrounding free fluid or free air. There is no other abnormal bowel wall thickening or inflammatory change. Vascular/Lymphatic: Abdominal aorta is normal in course and caliber. The major branch vessels are patent. The main portal and splenic veins are patent. There is no abdominal or pelvic lymphadenopathy.  Reproductive: The prostate and seminal vesicles are unremarkable. Other: There is no ascites or free air. Musculoskeletal: There is no acute osseous abnormality or suspicious osseous lesion. IMPRESSION: 1. Prominent caliber appendix measuring up to 9 mm distally but without appreciable wall thickening or significant surrounding inflammatory change. Findings are felt unlikely to reflect early acute appendicitis,  but recommend correlation with physical exam and lab values. 2. No other evidence of acute pathology in the abdomen or pelvis. Electronically Signed   By: Lesia HausenPeter  Noone M.D.   On: 02/01/2022 13:03      Assessment/Plan RLQ abdominal pain, likely early appendicitis The patient has been seen, examined, chart, labs, and vitals reviewed.  His story is not classic for appendicitis but he is tender in his RLQ and across his lower abdomen c/w the location of his appendix on his CT scan.  He has had a temp max of 101.7.  his WBC is normal with a mild left shift only.  No other findings are noted on his CT scan or history to suggest any other etiology of his symptoms.  We have provided the options of lap appy tonight vs abx and conservative management with OR tomorrow if worse and abx if improving.  Consensus between patient and medical team is lap appy tonight.  We have discussed the procedure and risks of appendectomy. The risks include but are not limited to bleeding, infection, wound problems, anesthesia, injury to intra-abdominal organs, possibility of postoperative ileus. He seems to understand and agrees with the plan.  FEN - NPO VTE - lovenox tomorrow ID - Rocephin/Flagyl Admit - obs  I reviewed ED provider notes, last 24 h vitals and pain scores, last 48 h intake and output, last 24 h labs and trends, and last 24 h imaging results.  Letha CapeKelly E Damek Ende, [facility] 02/01/2022, 3:32 PM Please see Amion for pager number during day hours 7:00am-4:30pm or 7:00am -11:30am on  weekends

## 2022-02-01 NOTE — Anesthesia Postprocedure Evaluation (Signed)
Anesthesia Post Note  Patient: Geoffrey Henderson  Procedure(s) Performed: APPENDECTOMY LAPAROSCOPIC     Patient location during evaluation: PACU Anesthesia Type: General Level of consciousness: sedated and patient cooperative Pain management: pain level controlled Vital Signs Assessment: post-procedure vital signs reviewed and stable Respiratory status: spontaneous breathing Cardiovascular status: stable Anesthetic complications: no   No notable events documented.  Last Vitals:  Vitals:   02/01/22 2008 02/01/22 2026  BP: 127/81 125/79  Pulse: (!) 110 (!) 107  Resp: (!) 22   Temp: 37.9 C 37.9 C  SpO2: 94% 96%    Last Pain:  Vitals:   02/01/22 2026  TempSrc: Oral  PainSc:                  [name]

## 2022-02-01 NOTE — Anesthesia Procedure Notes (Signed)
Procedure Name: Intubation Date/Time: 02/01/2022 5:53 PM Performed by: Rosaland Lao, CRNA Pre-anesthesia Checklist: Patient identified, Emergency Drugs available, Suction available and Patient being monitored Patient Re-evaluated:Patient Re-evaluated prior to induction Oxygen Delivery Method: Circle system utilized Preoxygenation: Pre-oxygenation with 100% oxygen Induction Type: IV induction and Rapid sequence Laryngoscope Size: Glidescope and 4 Grade View: Grade I Tube type: Oral Tube size: 7.5 mm Number of attempts: 1 Airway Equipment and Method: Stylet and Oral airway Placement Confirmation: ETT inserted through vocal cords under direct vision, positive ETCO2 and breath sounds checked- equal and bilateral Secured at: 22 cm Tube secured with: Tape Dental Injury: Teeth and Oropharynx as per pre-operative assessment

## 2022-02-01 NOTE — Op Note (Signed)
Operative Report  Geoffrey Henderson 51 y.o. male  [id number]  [id number]  02/01/2022  Surgeon: Phylliss Blakes MD FACS   Procedure performed: Laparoscopic Appendectomy   Preop diagnosis: Acute appendicitis   Post-op diagnosis/intraop findings: Acute appendicitis   Specimens: appendix   EBL: minimal   Complications: none   Description of procedure: After obtaining informed consent the patient was brought to the operating room. Antibiotics were administered. SCD's were applied. General endotracheal anesthesia was initiated and a formal time-out was performed.  Foley catheter was inserted which was removed at the end of the case.  The abdomen was prepped and draped in the usual sterile fashion and the abdomen was entered using a left subcostal Veress needle and insufflated to 15 mmHg. A 5 mm trocar and camera were then introduced in the supraumbilical position using Optiview technique, the abdomen was inspected and there is no evidence of injury from our entry.  There is a singular adhesive band extending from the falciform ligament towards the umbilicus but no other intra-abdominal adhesions.  This adhesive band was excised with the harmonic scalpel ultimately.  A suprapubic 5 mm trocar and a left lower quadrant 12 mm trocar were introduced under direct visualization following infiltration with local. The patient was then placed in Trendelenburg and rotated to the left and the small bowel was reflected cephalad. The appendix was visualized: It is mildly dilated and does appear erythematous consistent with early acute appendicitis, no free fluid or purulence was present. The appendix was partially retrocecal. A combination of blunt dissection and harmonic scalpel were used to free it of its retroperitoneal attachments and mobilized the distal cecum slightly. Great care was taken to ensure no injury to surrounding retroperitoneal structures, cecum or terminal ileum.  The mesoappendix was divided with the  harmonic scalpel, skeletonizing the base of the appendix.  A white load 45 mm Endo GIA stapler was then used to transect the appendix, taking a cuff of healthy appearing cecum with the specimen.  The stapler was not quite completely across so an additional fire was used to complete the last few millimeters of the transection.  The appendix was placed in an Endo Catch bag and removed through our 12 mm trocar site.  The staple line was inspected and confirmed to be hemostatic, viable and intact.  The ileocecal valve was inspected and there was no impingement on this.  The small bowel was run proximally from the ileocecal valve several feet with no abnormalities identified.  The visualizable surfaces of the abdominal cavity were without any other significant abnormalities.  The liver appears normal, the surface of the colon and stomach appear within normal limits, the peritoneal surfaces all appear normal.  He does have a very small right indirect inguinal hernia defect without any defect on the left.  No apparent recurrence of his umbilical hernia.  The 53mm trocar site in the left lower quadrant was closed with a 0 vicryl in the fascia under direct visualization using a PMI device. The abdomen was desufflated and all trocars removed. The skin incisions were closed with subcuticular 4-0 monocryl; benzoin, Steri-Strips and Band-Aids were. The patient was awakened, extubated and transported to the recovery room in stable condition.    All counts were correct at the completion of the case.

## 2022-02-01 NOTE — Anesthesia Preprocedure Evaluation (Addendum)
Anesthesia Evaluation  Patient identified by MRN, date of birth, ID band Patient awake    Reviewed: Allergy & Precautions, NPO status , Patient's Chart, lab work & pertinent test results  Airway Mallampati: III  TM Distance: >3 FB Neck ROM: Full    Dental  (+) Dental Advisory Given, Teeth Intact   Pulmonary neg pulmonary ROS, former smoker,    Pulmonary exam normal breath sounds clear to auscultation       Cardiovascular negative cardio ROS Normal cardiovascular exam Rhythm:Regular Rate:Normal     Neuro/Psych negative neurological ROS     GI/Hepatic negative GI ROS, Neg liver ROS,   Endo/Other  negative endocrine ROS  Renal/GU negative Renal ROS     Musculoskeletal negative musculoskeletal ROS (+)   Abdominal   Peds  Hematology negative hematology ROS (+)   Anesthesia Other Findings   Reproductive/Obstetrics                            Anesthesia Physical Anesthesia Plan  ASA: 2  Anesthesia Plan: General   Post-op Pain Management: Tylenol PO (pre-op)* and Toradol IV (intra-op)*   Induction: Intravenous, Rapid sequence and Cricoid pressure planned  PONV Risk Score and Plan: 4 or greater and Ondansetron, Dexamethasone, Treatment may vary due to age or medical condition, Midazolam and Scopolamine patch - Pre-op  Airway Management Planned: Oral ETT and Video Laryngoscope Planned  Additional Equipment:   Intra-op Plan:   Post-operative Plan: Extubation in OR  Informed Consent: I have reviewed the patients History and Physical, chart, labs and discussed the procedure including the risks, benefits and alternatives for the proposed anesthesia with the patient or authorized representative who has indicated his/her understanding and acceptance.     Dental advisory given  Plan Discussed with: CRNA  Anesthesia Plan Comments:        Anesthesia Quick Evaluation

## 2022-02-01 NOTE — ED Notes (Signed)
Patient is at the bedside attempting to urinate with some difficulty

## 2022-02-02 ENCOUNTER — Encounter (HOSPITAL_COMMUNITY): Payer: Self-pay | Admitting: Surgery

## 2022-02-02 ENCOUNTER — Other Ambulatory Visit (HOSPITAL_COMMUNITY): Payer: Self-pay

## 2022-02-02 LAB — CBC
HCT: 43 % (ref 39.0–52.0)
Hemoglobin: 14.6 g/dL (ref 13.0–17.0)
MCH: 31.5 pg (ref 26.0–34.0)
MCHC: 34 g/dL (ref 30.0–36.0)
MCV: 92.9 fL (ref 80.0–100.0)
Platelets: 194 10*3/uL (ref 150–400)
RBC: 4.63 MIL/uL (ref 4.22–5.81)
RDW: 11.7 % (ref 11.5–15.5)
WBC: 4.9 10*3/uL (ref 4.0–10.5)
nRBC: 0 % (ref 0.0–0.2)

## 2022-02-02 LAB — BASIC METABOLIC PANEL
Anion gap: 8 (ref 5–15)
BUN: 12 mg/dL (ref 6–20)
CO2: 25 mmol/L (ref 22–32)
Calcium: 8.9 mg/dL (ref 8.9–10.3)
Chloride: 106 mmol/L (ref 98–111)
Creatinine, Ser: 1.13 mg/dL (ref 0.61–1.24)
GFR, Estimated: >60 mL/min (ref >60)
Glucose, Bld: 142 mg/dL — ABNORMAL HIGH (ref 70–99)
Potassium: 4 mmol/L (ref 3.5–5.1)
Sodium: 139 mmol/L (ref 135–145)

## 2022-02-02 LAB — HIV ANTIBODY (ROUTINE TESTING W REFLEX): HIV Screen 4th Generation wRfx: NONREACTIVE

## 2022-02-02 MED ORDER — OXYCODONE HCL 5 MG PO TABS
5.0000 mg | ORAL_TABLET | Freq: Four times a day (QID) | ORAL | 0 refills | Status: DC | PRN
  Filled 2022-02-02: qty 15, 4d supply, fill #0

## 2022-02-02 MED ORDER — ACETAMINOPHEN 500 MG PO TABS: 1000.0000 mg | ORAL_TABLET | Freq: Three times a day (TID) | ORAL | 0 refills | Status: AC | PRN

## 2022-02-02 MED ORDER — POLYETHYLENE GLYCOL 3350 17 G PO PACK: 17.0000 g | PACK | Freq: Every day | ORAL | 0 refills | Status: DC | PRN

## 2022-02-02 NOTE — Discharge Summary (Signed)
Patient ID: Geoffrey Henderson [id number] 20-Feb-1971 51 y.o.  Admit date: 02/01/2022 Discharge date: 02/02/2022  Admitting Diagnosis: RLQ abdominal pain, likely early appendicitis  Discharge Diagnosis Patient Active Problem List   Diagnosis Date Noted   Acute appendicitis 02/01/2022    Consultants None  HPI:  This is a 51 yo male with no significant PMH who began having RLQ abdominal pain last night.  This has spread to his LLQ and to his right flank at times.  He had some nausea but no vomiting.  He had subjective fevers at home, but in the [facility] ED had a temp of 101.7.  He denies any dysuria.  He was unable to sleepy last night due to pain  His last BM was last night.  He takes Linzess to move his bowels 1-2 times a day.  He presented to [facility] ED this morning where he has been found to have normal labs and a CT scan that reveals a prominent caliber appendix measuring 21mm distally but no wall thickening or inflammatory changes.  These findings are not felt to reflect early appendicitis.  Due to location though, we have been called to see and assess the patient.  Procedures Dr. Fredricka Bonine - Laparoscopic Appendectomy  Hospital Course:  The patient was admitted and underwent a laparoscopic appendectomy.  The patient tolerated the procedure well.  On POD 1, the patient was tolerating a regular diet, voiding well, mobilizing, and pain was controlled with oral pain medications.  Reports he feels much better. The patient was stable for DC home at this time with appropriate follow up made. Discussed discharge instructions, restrictions and return/call back precautions. A note was provided for work.     Physical Exam: Gen:  Alert, NAD, pleasant Card:  RRR Pulm:  CTAB, no W/R/R, effort normal Abd: Soft, ND, appropriately tender, no peritonitis, +BS, Incisions with glue intact appears well and are without drainage, bleeding, or signs of infection Ext:  No LE edema Psych: A&Ox3  Skin: no rashes  noted, warm and dry   Allergies as of 02/02/2022   No Known Allergies      Medication List     STOP taking these medications    cephALEXin 500 MG capsule Commonly known as: KEFLEX   cyclobenzaprine 10 MG tablet Commonly known as: FLEXERIL   HYDROcodone-acetaminophen 10-325 MG tablet Commonly known as: NORCO   naproxen 500 MG tablet Commonly known as: NAPROSYN   traMADol 50 MG tablet Commonly known as: ULTRAM       TAKE these medications    acetaminophen 500 MG tablet Commonly known as: TYLENOL Take 2 tablets (1,000 mg total) by mouth every 8 (eight) hours as needed.   fluticasone 50 MCG/ACT nasal spray Commonly known as: FLONASE Place 2 sprays into both nostrils daily as needed for allergies.   Lumigan 0.01 % Soln Generic drug: bimatoprost Place 1 drop into both eyes at bedtime.   montelukast 10 MG tablet Commonly known as: SINGULAIR Take 10 mg by mouth daily as needed ("for allergies").   naproxen sodium 220 MG tablet Commonly known as: ALEVE Take 220-440 mg by mouth 2 (two) times daily as needed (for pain).   oxyCODONE 5 MG immediate release tablet Commonly known as: Oxy IR/ROXICODONE Take 1 tablet (5 mg total) by mouth every 6 (six) hours as needed for breakthrough pain.   polyethylene glycol 17 g packet Commonly known as: MIRALAX / GLYCOLAX Take 17 g by mouth daily as needed.   ZyrTEC Allergy  10 MG tablet Generic drug: cetirizine Take 10 mg by mouth daily as needed for allergies or rhinitis.          Follow-up Information     [facility] [place] Follow up on 02/24/2022.   Specialty: General Surgery Why: 1:30pm, arrive by 1:00pm for paperwork and check in process. Contact information: [address] [place] [address] [phone]                 Signed: Alferd Apa, [facility] 02/02/2022, 8:41 AM Please see Amion for pager number during day hours 7:00am-4:30pm

## 2022-02-02 NOTE — Plan of Care (Signed)
  Problem: Education: Goal: Knowledge of General Education information will improve Description: Including pain rating scale, medication(s)/side effects and non-pharmacologic comfort measures Outcome: Adequate for Discharge   Problem: Health Behavior/Discharge Planning: Goal: Ability to manage health-related needs will improve Outcome: Adequate for Discharge   Problem: Clinical Measurements: Goal: Ability to maintain clinical measurements within normal limits will improve Outcome: Adequate for Discharge Goal: Will remain free from infection Outcome: Adequate for Discharge Goal: Diagnostic test results will improve Outcome: Adequate for Discharge Goal: Respiratory complications will improve Outcome: Adequate for Discharge Goal: Cardiovascular complication will be avoided Outcome: Adequate for Discharge   Problem: Activity: Goal: Risk for activity intolerance will decrease Outcome: Adequate for Discharge   Problem: Nutrition: Goal: Adequate nutrition will be maintained Outcome: Adequate for Discharge   Problem: Coping: Goal: Level of anxiety will decrease Outcome: Adequate for Discharge   Problem: Elimination: Goal: Will not experience complications related to bowel motility Outcome: Adequate for Discharge Goal: Will not experience complications related to urinary retention Outcome: Adequate for Discharge   Problem: Pain Managment: Goal: General experience of comfort will improve Outcome: Adequate for Discharge   Problem: Safety: Goal: Ability to remain free from injury will improve Outcome: Adequate for Discharge   Problem: Skin Integrity: Goal: Risk for impaired skin integrity will decrease Outcome: Adequate for Discharge   Problem: Education: Goal: Required Educational Video(s) Outcome: Adequate for Discharge   Problem: Clinical Measurements: Goal: Ability to maintain clinical measurements within normal limits will improve Outcome: Adequate for  Discharge Goal: Postoperative complications will be avoided or minimized Outcome: Adequate for Discharge   Problem: Skin Integrity: Goal: Demonstration of wound healing without infection will improve Outcome: Adequate for Discharge   

## 2022-02-02 NOTE — Progress Notes (Signed)
Transition of Care [facility]) Screening Note  Patient Details  Name: JERAL ZICK Date of Birth: December 23, 1970  Transition of Care [facility]) CM/SW Contact:    Ewing Schlein, LCSW Phone Number: 02/02/2022, 8:52 AM  Transition of Care Department [facility]) has reviewed patient and no TOC needs have been identified at this time. We will continue to monitor patient advancement through interdisciplinary progression rounds. If new patient transition needs arise, please place a TOC consult.

## 2022-02-02 NOTE — Progress Notes (Signed)
Assessment unchanged. Pt and wife verbalized understanding of dc instructions through teach back including medications and follow up care. Dc'd via wc to front entrance accompanied by NT and wife.

## 2022-02-03 LAB — SURGICAL PATHOLOGY

## 2022-11-16 ENCOUNTER — Other Ambulatory Visit: Payer: Self-pay | Admitting: Internal Medicine

## 2023-05-15 ENCOUNTER — Other Ambulatory Visit: Payer: Self-pay | Admitting: Internal Medicine

## 2023-06-14 ENCOUNTER — Encounter: Payer: BC Managed Care – PPO | Admitting: Internal Medicine

## 2023-06-20 ENCOUNTER — Encounter: Payer: BC Managed Care – PPO | Admitting: Internal Medicine

## 2023-07-04 ENCOUNTER — Ambulatory Visit (INDEPENDENT_AMBULATORY_CARE_PROVIDER_SITE_OTHER): Payer: BC Managed Care – PPO | Admitting: Internal Medicine

## 2023-07-04 ENCOUNTER — Other Ambulatory Visit: Payer: BC Managed Care – PPO

## 2023-07-04 VITALS — BP 130/80 | HR 73 | Ht 67.0 in | Wt 216.6 lb

## 2023-07-04 DIAGNOSIS — E785 Hyperlipidemia, unspecified: Secondary | ICD-10-CM

## 2023-07-04 DIAGNOSIS — E669 Obesity, unspecified: Secondary | ICD-10-CM

## 2023-07-04 DIAGNOSIS — E559 Vitamin D deficiency, unspecified: Secondary | ICD-10-CM | POA: Diagnosis not present

## 2023-07-04 DIAGNOSIS — N400 Enlarged prostate without lower urinary tract symptoms: Secondary | ICD-10-CM | POA: Diagnosis not present

## 2023-07-04 DIAGNOSIS — Z0130 Encounter for examination of blood pressure without abnormal findings: Secondary | ICD-10-CM

## 2023-07-04 DIAGNOSIS — E782 Mixed hyperlipidemia: Secondary | ICD-10-CM | POA: Diagnosis not present

## 2023-07-04 DIAGNOSIS — Z0001 Encounter for general adult medical examination with abnormal findings: Secondary | ICD-10-CM | POA: Diagnosis not present

## 2023-07-04 DIAGNOSIS — R7303 Prediabetes: Secondary | ICD-10-CM

## 2023-07-04 DIAGNOSIS — I10 Essential (primary) hypertension: Secondary | ICD-10-CM

## 2023-07-04 DIAGNOSIS — R7301 Impaired fasting glucose: Secondary | ICD-10-CM

## 2023-07-04 DIAGNOSIS — M62830 Muscle spasm of back: Secondary | ICD-10-CM

## 2023-07-04 MED ORDER — CYCLOBENZAPRINE HCL 10 MG PO TABS: 10.0000 mg | ORAL_TABLET | Freq: Three times a day (TID) | ORAL | 2 refills | Status: DC | PRN

## 2023-07-04 MED ORDER — CIPROFLOXACIN HCL 500 MG PO TABS: 500.0000 mg | ORAL_TABLET | Freq: Two times a day (BID) | ORAL | 1 refills | Status: DC

## 2023-07-04 NOTE — Progress Notes (Signed)
Established Patient Office Visit  Subjective:  Patient ID: Geoffrey Henderson, male    DOB: 1970/12/04  Age: 52 y.o. MRN: [id number]  Chief Complaint  Patient presents with   Annual Exam    CPE    No new complaints, here for CPE, lab review and medication refills. C/o right shoulder pain radiating to his elbow and hands. Diagnosed with prediabetes at previous provider.    No other concerns at this time.   No past medical history on file.  Past Surgical History:  Procedure Laterality Date   HERNIA REPAIR     LAPAROSCOPIC APPENDECTOMY N/A 02/01/2022   Procedure: APPENDECTOMY LAPAROSCOPIC;  Surgeon: Geoffrey Bue, MD;  Location: [facility];  Service: General;  Laterality: N/A;    Social History   Socioeconomic History   Marital status: Single    Spouse name: Not on file   Number of children: Not on file   Years of education: Not on file   Highest education level: Not on file  Occupational History   Not on file  Tobacco Use   Smoking status: Former    Current packs/day: 0.00    Types: Cigarettes    Quit date: 06/06/2011    Years since quitting: 12.0   Smokeless tobacco: Not on file  Substance and Sexual Activity   Alcohol use: Yes    Comment: occasional   Drug use: Not Currently    Comment: hookah   Sexual activity: Yes    Birth control/protection: Condom  Other Topics Concern   Not on file  Social History Narrative   Not on file   Social Determinants of Health   Financial Resource Strain: Not on file  Food Insecurity: Not on file  Transportation Needs: Not on file  Physical Activity: Not on file  Stress: Not on file  Social Connections: Not on file  Intimate Partner Violence: Not on file    No family history on file.  No Known Allergies  Review of Systems  Constitutional: Negative.   HENT: Negative.    Eyes:  Positive for blurred vision.  Respiratory: Negative.    Cardiovascular: Negative.   Gastrointestinal: Negative.   Genitourinary: Negative.    Skin: Negative.   Neurological: Negative.   Endo/Heme/Allergies: Negative.        Objective:   BP 130/80   Pulse 73   Ht 5\' 7"  (1.702 m)   Wt 216 lb 9.6 oz (98.2 kg)   SpO2 97%   BMI 33.92 kg/m   Vitals:   07/04/23 1349  BP: 130/80  Pulse: 73  Height: 5\' 7"  (1.702 m)  Weight: 216 lb 9.6 oz (98.2 kg)  SpO2: 97%  BMI (Calculated): 33.92    Physical Exam Vitals reviewed.  Constitutional:      Appearance: Normal appearance.  HENT:     Head: Normocephalic.     Left Ear: There is no impacted cerumen.     Nose: Nose normal.     Mouth/Throat:     Mouth: Mucous membranes are moist.     Pharynx: No posterior oropharyngeal erythema.  Eyes:     Extraocular Movements: Extraocular movements intact.     Pupils: Pupils are equal, round, and reactive to light.  Cardiovascular:     Rate and Rhythm: Regular rhythm.     Chest Wall: PMI is not displaced.     Pulses: Normal pulses.     Heart sounds: Normal heart sounds. No murmur heard. Pulmonary:     Effort: Pulmonary effort  is normal.     Breath sounds: Normal air entry. No rhonchi or rales.  Abdominal:     General: Abdomen is flat. Bowel sounds are normal. There is no distension.     Palpations: Abdomen is soft. There is no hepatomegaly, splenomegaly or mass.     Tenderness: There is no abdominal tenderness.  Musculoskeletal:        General: Normal range of motion.     Cervical back: Normal range of motion and neck supple. Spasms (r cervical extending to trapezius) present.     Thoracic back: Spasms present.     Right lower leg: No edema.     Left lower leg: No edema.  Skin:    General: Skin is warm and dry.  Neurological:     General: No focal deficit present.     Mental Status: He is alert and oriented to person, place, and time.     Cranial Nerves: No cranial nerve deficit.     Motor: No weakness.  Psychiatric:        Mood and Affect: Mood normal.        Behavior: Behavior normal.    No results found for any  visits on 07/04/23.  No results found for this or any previous visit (from the past 2160 hour(Geoffrey Henderson)).    Assessment & Plan:  As per problem list. Problem List Items Addressed This Visit       Other   Vitamin D deficiency   Relevant Orders   Vitamin D (25 hydroxy)   Prediabetes - Primary   Mixed hyperlipidemia   Relevant Orders   CBC With Diff/Platelet   Comprehensive metabolic panel   Other Visit Diagnoses     Annual visit for general adult medical examination with abnormal findings       Benign prostatic hyperplasia without lower urinary tract symptoms       Relevant Orders   PSA       Return in about 6 months (around 01/02/2024) for fu with labs prior.   Total time spent: 45 minutes  Geoffrey Fuse, MD  07/04/2023   This document may have been prepared by [facility] Voice Recognition software and as such may include unintentional dictation errors.

## 2023-07-05 LAB — CMP14+EGFR
ALT: 21 [IU]/L (ref 0–44)
AST: 20 [IU]/L (ref 0–40)
Albumin: 4.4 g/dL (ref 3.8–4.9)
Alkaline Phosphatase: 39 [IU]/L — ABNORMAL LOW (ref 44–121)
BUN/Creatinine Ratio: 9 (ref 9–20)
BUN: 10 mg/dL (ref 6–24)
Bilirubin Total: 0.4 mg/dL (ref 0.0–1.2)
CO2: 21 mmol/L (ref 20–29)
Calcium: 9.3 mg/dL (ref 8.7–10.2)
Chloride: 105 mmol/L (ref 96–106)
Creatinine, Ser: 1.1 mg/dL (ref 0.76–1.27)
Globulin, Total: 2.3 g/dL (ref 1.5–4.5)
Glucose: 99 mg/dL (ref 70–99)
Potassium: 4.2 mmol/L (ref 3.5–5.2)
Sodium: 139 mmol/L (ref 134–144)
Total Protein: 6.7 g/dL (ref 6.0–8.5)
eGFR: 81 mL/min/{1.73_m2} (ref >59)

## 2023-07-05 LAB — CBC WITH DIFF/PLATELET
Basophils Absolute: 0.1 10*3/uL (ref 0.0–0.2)
Basos: 1 %
EOS (ABSOLUTE): 0.1 10*3/uL (ref 0.0–0.4)
Eos: 3 %
Hematocrit: 43.3 % (ref 37.5–51.0)
Hemoglobin: 14.6 g/dL (ref 13.0–17.7)
Immature Grans (Abs): 0 10*3/uL (ref 0.0–0.1)
Immature Granulocytes: 0 %
Lymphocytes Absolute: 2.2 10*3/uL (ref 0.7–3.1)
Lymphs: 40 %
MCH: 32 pg (ref 26.6–33.0)
MCHC: 33.7 g/dL (ref 31.5–35.7)
MCV: 95 fL (ref 79–97)
Monocytes Absolute: 0.4 10*3/uL (ref 0.1–0.9)
Monocytes: 8 %
Neutrophils Absolute: 2.6 10*3/uL (ref 1.4–7.0)
Neutrophils: 48 %
Platelets: 243 10*3/uL (ref 150–450)
RBC: 4.56 x10E6/uL (ref 4.14–5.80)
RDW: 12.4 % (ref 11.6–15.4)
WBC: 5.5 10*3/uL (ref 3.4–10.8)

## 2023-07-05 LAB — VITAMIN D 25 HYDROXY (VIT D DEFICIENCY, FRACTURES): Vit D, 25-Hydroxy: 21.4 ng/mL — ABNORMAL LOW (ref 30.0–100.0)

## 2023-07-05 LAB — LIPID PANEL
Chol/HDL Ratio: 4.1 ratio (ref 0.0–5.0)
Cholesterol, Total: 202 mg/dL — ABNORMAL HIGH (ref 100–199)
HDL: 49 mg/dL (ref >39)
LDL Chol Calc (NIH): 133 mg/dL — ABNORMAL HIGH (ref 0–99)
Triglycerides: 114 mg/dL (ref 0–149)
VLDL Cholesterol Cal: 20 mg/dL (ref 5–40)

## 2023-07-05 LAB — TSH: TSH: 2.18 u[IU]/mL (ref 0.450–4.500)

## 2023-07-05 LAB — PSA: Prostate Specific Ag, Serum: 1.4 ng/mL (ref 0.0–4.0)

## 2023-07-05 LAB — HEMOGLOBIN A1C
Est. average glucose Bld gHb Est-mCnc: 120 mg/dL
Hgb A1c MFr Bld: 5.8 % — ABNORMAL HIGH (ref 4.8–5.6)

## 2023-07-07 ENCOUNTER — Other Ambulatory Visit (HOSPITAL_COMMUNITY): Payer: Self-pay

## 2023-07-07 ENCOUNTER — Other Ambulatory Visit: Payer: Self-pay | Admitting: Internal Medicine

## 2023-07-07 DIAGNOSIS — E559 Vitamin D deficiency, unspecified: Secondary | ICD-10-CM

## 2023-07-07 MED ORDER — VITAMIN D (ERGOCALCIFEROL) 1.25 MG (50000 UNIT) PO CAPS
50000.0000 [IU] | ORAL_CAPSULE | ORAL | 0 refills | Status: AC
  Filled 2023-07-07: qty 4, 28d supply, fill #0

## 2023-07-07 MED ORDER — VITAMIN D 25 MCG (1000 UNIT) PO TABS
1000.0000 [IU] | ORAL_TABLET | Freq: Every day | ORAL | 3 refills | Status: AC
  Filled 2023-07-07 – 2024-02-27 (×2): qty 90, 90d supply, fill #0
  Filled 2024-05-30 (×2): qty 90, 90d supply, fill #1

## 2023-07-17 ENCOUNTER — Other Ambulatory Visit (HOSPITAL_COMMUNITY): Payer: Self-pay

## 2023-08-15 IMAGING — CT CT ABD-PELV W/ CM
2 of 5 series · 16 of 46 positions shown, 18 images · IV contrast (Omnipaque)
Comparison: CT abdomen/pelvis 05/26/2001

CLINICAL DATA: Lower abdominal pain, right lower back and left
thigh pain since yesterday afternoon

EXAM:
CT ABDOMEN AND PELVIS WITH CONTRAST
TECHNIQUE: Multidetector CT imaging of the abdomen and pelvis was performed
using the standard protocol following bolus administration of
intravenous contrast.

[Series 2: axial st · axial · 0.87mm/px · z∈[-482,-42]mm · 13 of 100 slices shown, 15 images]
[im 6/100  soft-tissue]
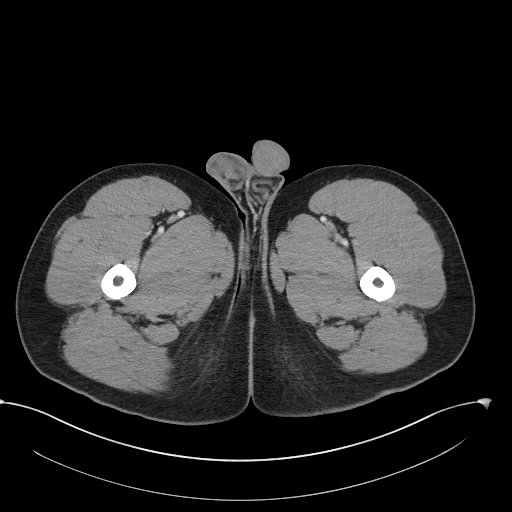
[im 6/100  bone]
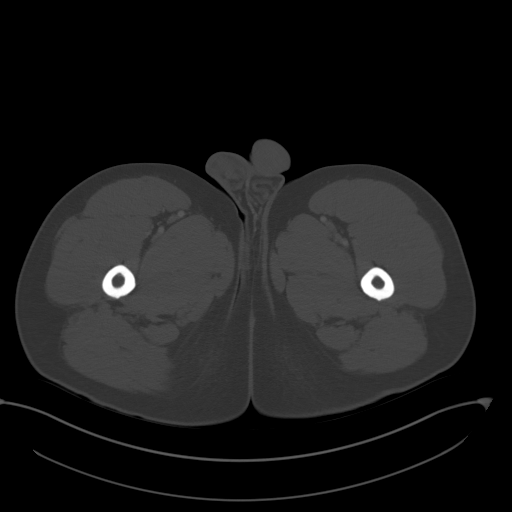
[im 16/100  soft-tissue]
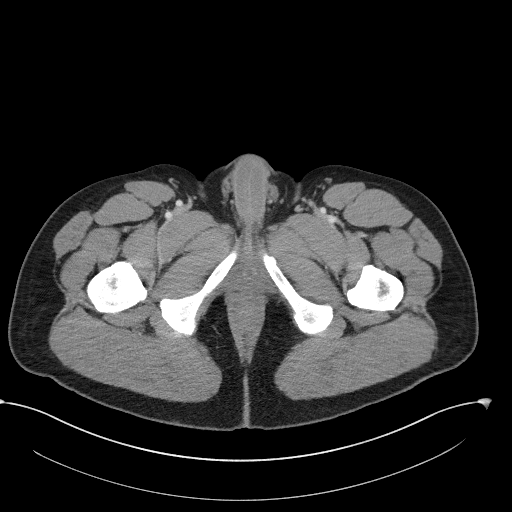
[im 21/100  soft-tissue]
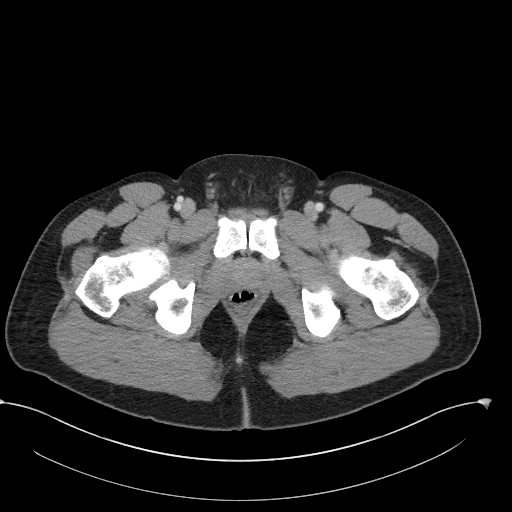
[im 27/100  soft-tissue]
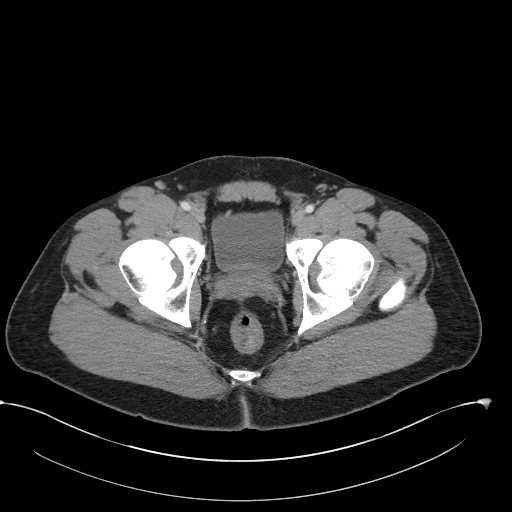
[im 37/100  soft-tissue]
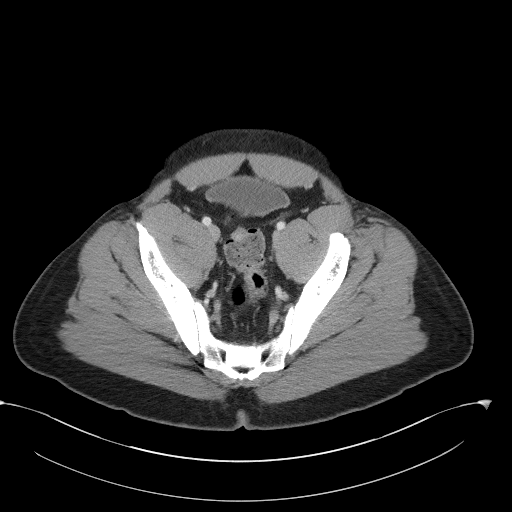
[im 42/100  soft-tissue]
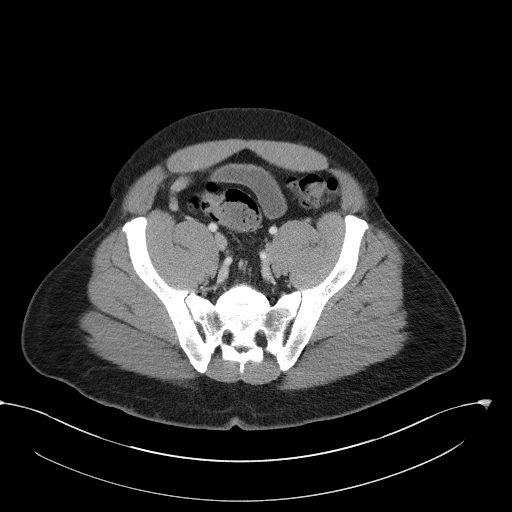
[im 53/100  soft-tissue]
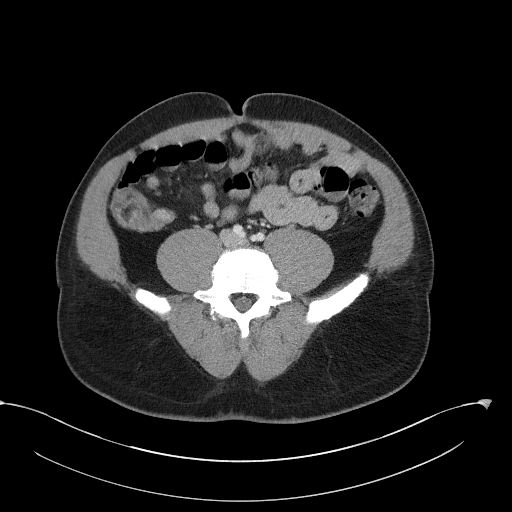
[im 58/100  soft-tissue]
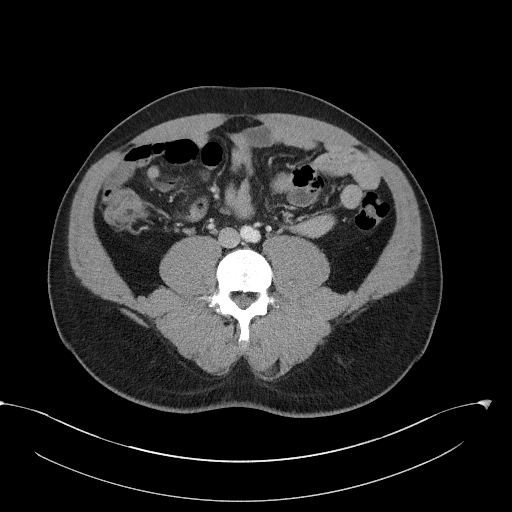
[im 63/100  soft-tissue]
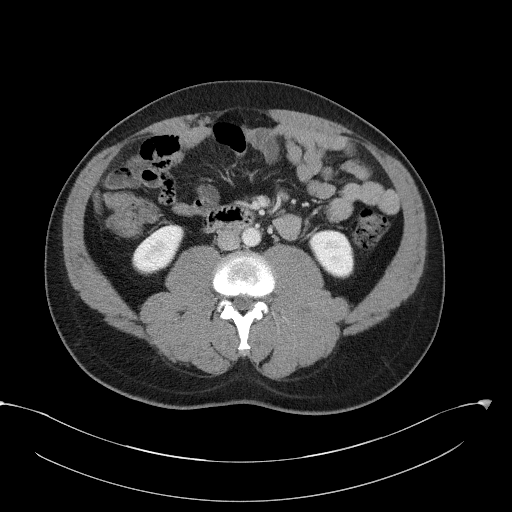
[im 63/100  bone]
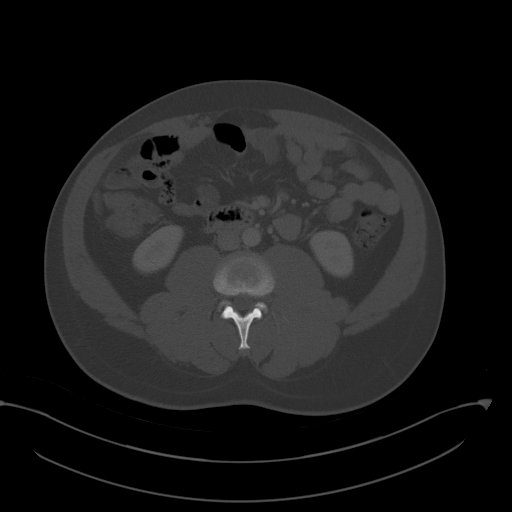
[im 73/100  soft-tissue]
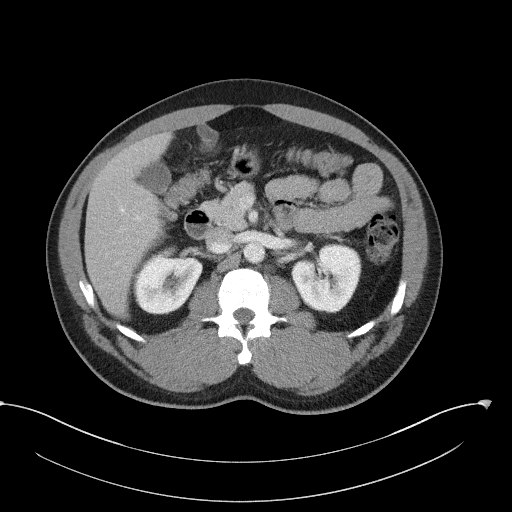
[im 79/100  soft-tissue]
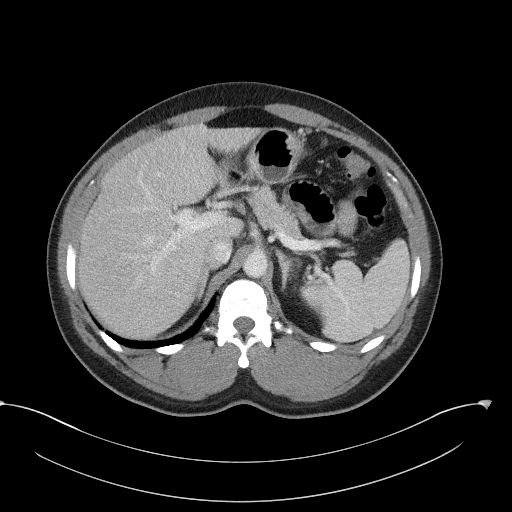
[im 84/100  soft-tissue]
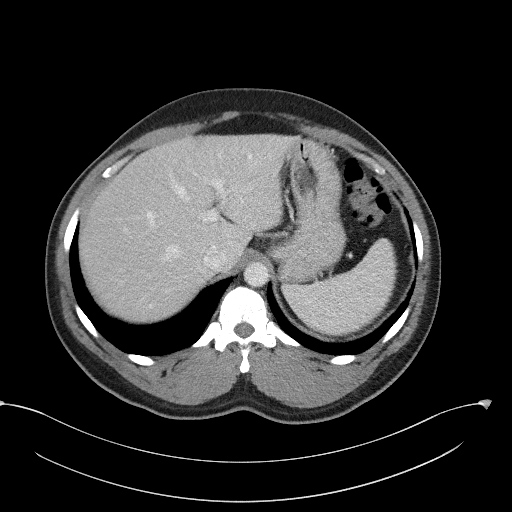
[im 94/100  soft-tissue]
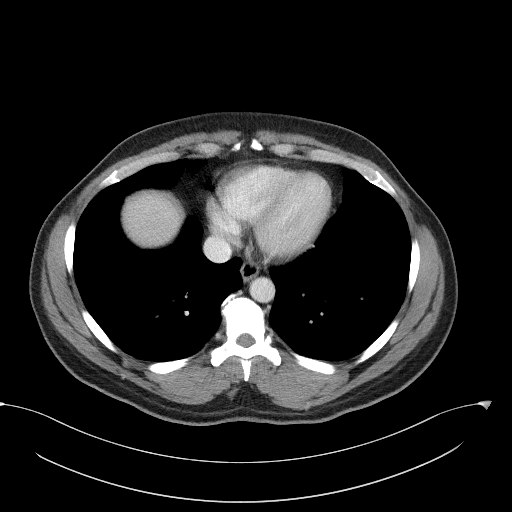

[Series 5: coronal st · coronal · 0.79mm/px · 3 of 104 slices shown]
[im 35/104  soft-tissue]
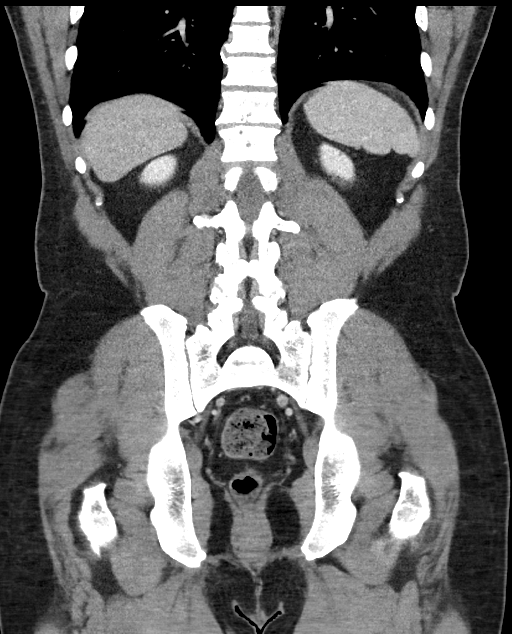
[im 46/104  soft-tissue]
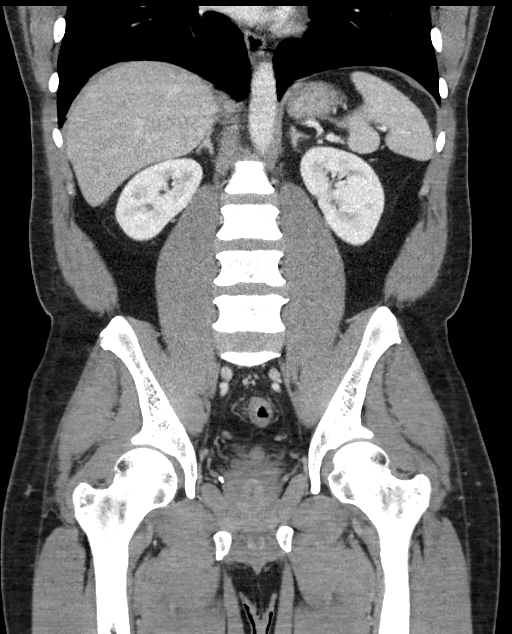
[im 58/104  soft-tissue]
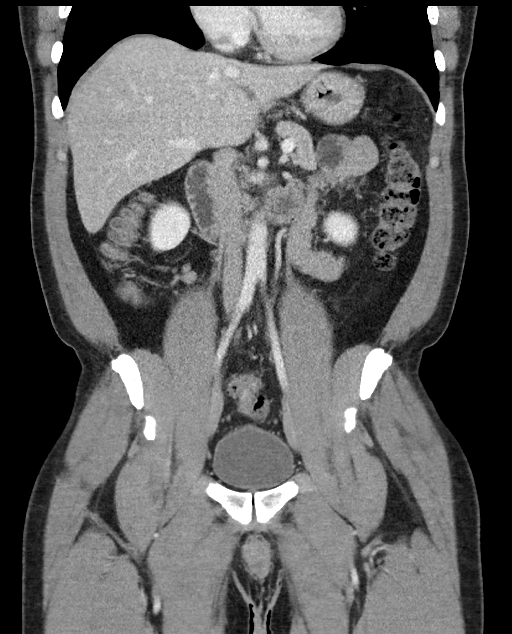

[16 of 46 positions shown; findings below may reference images not displayed]

RADIATION DOSE REDUCTION: This exam was performed according to the
departmental dose-optimization program which includes automated
exposure control, adjustment of the mA and/or kV according to
patient size and/or use of iterative reconstruction technique.

CONTRAST:  100mL OMNIPAQUE IOHEXOL 300 MG/ML  SOLN
FINDINGS: Lower chest: The lung bases are clear. The imaged heart is
unremarkable.

Hepatobiliary: The liver and gallbladder are unremarkable. There is
no biliary ductal dilatation.

Pancreas: Unremarkable.

Spleen: Unremarkable.

Adrenals/Urinary Tract: The adrenals are unremarkable.

The kidneys are unremarkable, with no focal lesion, stone,
hydronephrosis, or hydroureter. Bladder is unremarkable.

Stomach/Bowel: The stomach is unremarkable there is no evidence of
bowel obstruction.

The appendix is prominent in caliber measuring up to 9 mm distally
but without appreciable wall thickening, and no significant
surrounding inflammatory change. There is no surrounding free fluid
or free air.

There is no other abnormal bowel wall thickening or inflammatory
change.

Vascular/Lymphatic: Abdominal aorta is normal in course and caliber.
The major branch vessels are patent. The main portal and splenic
veins are patent. There is no abdominal or pelvic lymphadenopathy.

Reproductive: The prostate and seminal vesicles are unremarkable.

Other: There is no ascites or free air.

Musculoskeletal: There is no acute osseous abnormality or suspicious
osseous lesion.
IMPRESSION: 1. Prominent caliber appendix measuring up to 9 mm distally but
without appreciable wall thickening or significant surrounding
inflammatory change. Findings are felt unlikely to reflect early
acute appendicitis, but recommend correlation with physical exam and
lab values.
2. No other evidence of acute pathology in the abdomen or pelvis.

## 2023-11-11 ENCOUNTER — Other Ambulatory Visit: Payer: Self-pay | Admitting: Internal Medicine

## 2024-01-02 ENCOUNTER — Encounter: Payer: Self-pay | Admitting: Internal Medicine

## 2024-01-02 ENCOUNTER — Ambulatory Visit: Payer: BC Managed Care – PPO | Admitting: Internal Medicine

## 2024-01-02 ENCOUNTER — Other Ambulatory Visit

## 2024-01-02 VITALS — BP 122/66 | HR 70 | Temp 97.3°F | Ht 67.0 in | Wt 205.0 lb

## 2024-01-02 DIAGNOSIS — E782 Mixed hyperlipidemia: Secondary | ICD-10-CM

## 2024-01-02 DIAGNOSIS — M7061 Trochanteric bursitis, right hip: Secondary | ICD-10-CM

## 2024-01-02 DIAGNOSIS — R7303 Prediabetes: Secondary | ICD-10-CM

## 2024-01-02 DIAGNOSIS — M7712 Lateral epicondylitis, left elbow: Secondary | ICD-10-CM

## 2024-01-02 DIAGNOSIS — Z0130 Encounter for examination of blood pressure without abnormal findings: Secondary | ICD-10-CM

## 2024-01-02 DIAGNOSIS — I10 Essential (primary) hypertension: Secondary | ICD-10-CM

## 2024-01-02 LAB — GLUCOSE, POCT (MANUAL RESULT ENTRY): POC Glucose: 105 mg/dL — AB (ref 70–99)

## 2024-01-02 MED ORDER — PREDNISONE 20 MG PO TABS: 40.0000 mg | ORAL_TABLET | Freq: Every day | ORAL | 0 refills | Status: AC

## 2024-01-02 MED ORDER — MELOXICAM 15 MG PO TABS: 15.0000 mg | ORAL_TABLET | Freq: Every day | ORAL | 0 refills | Status: DC

## 2024-01-02 NOTE — Progress Notes (Signed)
 Established Patient Office Visit  Subjective:  Patient ID: Geoffrey Henderson, male    DOB: 08-12-1971  Age: 53 y.o. MRN: [id number]  Chief Complaint  Patient presents with   Follow-up    6 month lab results    Still c/o generalized arthralgia  right neck pain exacerbated by rotating his neck, right hip pain and left forearm pain.       No other concerns at this time.   No past medical history on file.  Past Surgical History:  Procedure Laterality Date   HERNIA REPAIR     LAPAROSCOPIC APPENDECTOMY N/A 02/01/2022   Procedure: APPENDECTOMY LAPAROSCOPIC;  Surgeon: Adalberto Acton, MD;  Location: [facility];  Service: General;  Laterality: N/A;    Social History   Socioeconomic History   Marital status: Single    Spouse name: Not on file   Number of children: Not on file   Years of education: Not on file   Highest education level: Not on file  Occupational History   Not on file  Tobacco Use   Smoking status: Former    Current packs/day: 0.00    Types: Cigarettes    Quit date: 06/06/2011    Years since quitting: 12.5   Smokeless tobacco: Not on file  Substance and Sexual Activity   Alcohol use: Yes    Comment: occasional   Drug use: Not Currently    Comment: hookah   Sexual activity: Yes    Birth control/protection: Condom  Other Topics Concern   Not on file  Social History Narrative   Not on file   Social Drivers of Health   Financial Resource Strain: Not on file  Food Insecurity: Not on file  Transportation Needs: Not on file  Physical Activity: Not on file  Stress: Not on file  Social Connections: Not on file  Intimate Partner Violence: Not on file    No family history on file.  No Known Allergies  Outpatient Medications Prior to Visit  Medication Sig Note   acetaminophen  (TYLENOL ) 500 MG tablet Take 2 tablets (1,000 mg total) by mouth every 8 (eight) hours as needed.    cholecalciferol  (VITAMIN D3) 25 MCG (1000 UNIT) tablet Take 1 tablet (1,000 Units  total) by mouth daily.    fluticasone (FLONASE) 50 MCG/ACT nasal spray Place 2 sprays into both nostrils daily as needed for allergies.    LUMIGAN 0.01 % SOLN Place 1 drop into both eyes at bedtime.    montelukast (SINGULAIR) 10 MG tablet TAKE 1 TABLET BY MOUTH EVERY MORNING    polyethylene glycol (MIRALAX  / GLYCOLAX ) 17 g packet Take 17 g by mouth daily as needed.    ZYRTEC ALLERGY 10 MG tablet Take 10 mg by mouth daily as needed for allergies or rhinitis.    [DISCONTINUED] celecoxib (CELEBREX) 200 MG capsule TAKE 2 CAPSULES BY MOUTH DAILY AS DIRECTED AS NEEDED    oxyCODONE  (OXY IR/ROXICODONE ) 5 MG immediate release tablet Take 1 tablet (5 mg total) by mouth every 6 (six) hours as needed for breakthrough pain. (Patient not taking: Reported on 01/02/2024) 01/02/2024: Needs refill   No facility-administered medications prior to visit.    Review of Systems  Constitutional: Negative.   HENT: Negative.    Eyes:  Positive for blurred vision.  Respiratory: Negative.    Cardiovascular: Negative.   Gastrointestinal: Negative.   Genitourinary: Negative.   Musculoskeletal:  Positive for joint pain and myalgias.  Skin: Negative.   Neurological: Negative.   Endo/Heme/Allergies: Negative.  Objective:   BP 122/66   Pulse 70   Temp (!) 97.3 F (36.3 C)   Ht 5\' 7"  (1.702 m)   Wt 205 lb (93 kg)   SpO2 98%   BMI 32.11 kg/m   Vitals:   01/02/24 0930  BP: 122/66  Pulse: 70  Temp: (!) 97.3 F (36.3 C)  Height: 5\' 7"  (1.702 m)  Weight: 205 lb (93 kg)  SpO2: 98%  BMI (Calculated): 32.1    Physical Exam Vitals reviewed.  Constitutional:      Appearance: Normal appearance.  HENT:     Head: Normocephalic.     Left Ear: There is no impacted cerumen.     Nose: Nose normal.     Mouth/Throat:     Mouth: Mucous membranes are moist.     Pharynx: No posterior oropharyngeal erythema.  Eyes:     Extraocular Movements: Extraocular movements intact.     Pupils: Pupils are equal,  round, and reactive to light.  Cardiovascular:     Rate and Rhythm: Regular rhythm.     Chest Wall: PMI is not displaced.     Pulses: Normal pulses.     Heart sounds: Normal heart sounds. No murmur heard. Pulmonary:     Effort: Pulmonary effort is normal.     Breath sounds: Normal air entry. No rhonchi or rales.  Abdominal:     General: Abdomen is flat. Bowel sounds are normal. There is no distension.     Palpations: Abdomen is soft. There is no hepatomegaly, splenomegaly or mass.     Tenderness: There is no abdominal tenderness.  Musculoskeletal:        General: Normal range of motion.     Cervical back: Normal range of motion and neck supple. Spasms (r cervical extending to trapezius) present.     Thoracic back: Spasms present.     Right lower leg: No edema.     Left lower leg: No edema.  Skin:    General: Skin is warm and dry.  Neurological:     General: No focal deficit present.     Mental Status: He is alert and oriented to person, place, and time.     Cranial Nerves: No cranial nerve deficit.     Motor: No weakness.  Psychiatric:        Mood and Affect: Mood normal.        Behavior: Behavior normal.      Results for orders placed or performed in visit on 01/02/24  POCT Glucose (CBG)  Result Value Ref Range   POC Glucose 105 (A) 70 - 99 mg/dl    Recent Results (from the past 2160 hours)  POCT Glucose (CBG)     Status: Abnormal   Collection Time: 01/02/24  9:33 AM  Result Value Ref Range   POC Glucose 105 (A) 70 - 99 mg/dl      Assessment & Plan:   Problem List Items Addressed This Visit       Musculoskeletal and Integument   Lateral epicondylitis of left elbow   Relevant Medications   predniSONE (DELTASONE) 20 MG tablet   meloxicam (MOBIC) 15 MG tablet   Trochanteric bursitis of right hip     Other   Prediabetes - Primary   Relevant Orders   POCT Glucose (CBG) (Completed)   Mixed hyperlipidemia    Return in about 1 month (around 02/01/2024).    Total time spent: 30 minutes  Arzella Bitters, MD  01/02/2024  This document may have been prepared by [employer] Voice Recognition software and as such may include unintentional dictation errors.

## 2024-01-03 ENCOUNTER — Encounter: Payer: Self-pay | Admitting: Internal Medicine

## 2024-01-03 LAB — CMP14+EGFR
ALT: 18 IU/L (ref 0–44)
AST: 18 IU/L (ref 0–40)
Albumin: 4.1 g/dL (ref 3.8–4.9)
Alkaline Phosphatase: 36 IU/L — ABNORMAL LOW (ref 44–121)
BUN/Creatinine Ratio: 8 — ABNORMAL LOW (ref 9–20)
BUN: 9 mg/dL (ref 6–24)
Bilirubin Total: 0.3 mg/dL (ref 0.0–1.2)
CO2: 23 mmol/L (ref 20–29)
Calcium: 9.4 mg/dL (ref 8.7–10.2)
Chloride: 104 mmol/L (ref 96–106)
Creatinine, Ser: 1.14 mg/dL (ref 0.76–1.27)
Globulin, Total: 2.8 g/dL (ref 1.5–4.5)
Glucose: 108 mg/dL — ABNORMAL HIGH (ref 70–99)
Potassium: 4.2 mmol/L (ref 3.5–5.2)
Sodium: 141 mmol/L (ref 134–144)
Total Protein: 6.9 g/dL (ref 6.0–8.5)
eGFR: 77 mL/min/{1.73_m2} (ref >59)

## 2024-01-03 LAB — LIPID PANEL
Chol/HDL Ratio: 3.5 ratio (ref 0.0–5.0)
Cholesterol, Total: 181 mg/dL (ref 100–199)
HDL: 52 mg/dL (ref >39)
LDL Chol Calc (NIH): 117 mg/dL — ABNORMAL HIGH (ref 0–99)
Triglycerides: 65 mg/dL (ref 0–149)
VLDL Cholesterol Cal: 12 mg/dL (ref 5–40)

## 2024-01-03 LAB — TSH: TSH: 2.55 u[IU]/mL (ref 0.450–4.500)

## 2024-01-03 LAB — HEMOGLOBIN A1C
Est. average glucose Bld gHb Est-mCnc: 123 mg/dL
Hgb A1c MFr Bld: 5.9 % — ABNORMAL HIGH (ref 4.8–5.6)

## 2024-01-08 ENCOUNTER — Other Ambulatory Visit (HOSPITAL_COMMUNITY): Payer: Self-pay

## 2024-01-08 ENCOUNTER — Other Ambulatory Visit: Payer: Self-pay

## 2024-01-08 DIAGNOSIS — E782 Mixed hyperlipidemia: Secondary | ICD-10-CM

## 2024-01-08 MED ORDER — ATORVASTATIN CALCIUM 10 MG PO TABS
10.0000 mg | ORAL_TABLET | Freq: Every day | ORAL | 0 refills | Status: DC
  Filled 2024-01-08: qty 90, 90d supply, fill #0

## 2024-01-08 NOTE — Progress Notes (Signed)
 Rx sent.

## 2024-02-02 ENCOUNTER — Encounter: Payer: Self-pay | Admitting: Internal Medicine

## 2024-02-02 ENCOUNTER — Ambulatory Visit: Admitting: Internal Medicine

## 2024-02-02 ENCOUNTER — Other Ambulatory Visit (HOSPITAL_COMMUNITY): Payer: Self-pay

## 2024-02-02 VITALS — BP 122/70 | HR 67 | Temp 97.1°F | Ht 67.0 in | Wt 208.2 lb

## 2024-02-02 DIAGNOSIS — M7061 Trochanteric bursitis, right hip: Secondary | ICD-10-CM | POA: Diagnosis not present

## 2024-02-02 DIAGNOSIS — E782 Mixed hyperlipidemia: Secondary | ICD-10-CM

## 2024-02-02 DIAGNOSIS — Z0001 Encounter for general adult medical examination with abnormal findings: Secondary | ICD-10-CM

## 2024-02-02 DIAGNOSIS — N400 Enlarged prostate without lower urinary tract symptoms: Secondary | ICD-10-CM

## 2024-02-02 DIAGNOSIS — M62830 Muscle spasm of back: Secondary | ICD-10-CM | POA: Diagnosis not present

## 2024-02-02 DIAGNOSIS — M7712 Lateral epicondylitis, left elbow: Secondary | ICD-10-CM

## 2024-02-02 DIAGNOSIS — Z0130 Encounter for examination of blood pressure without abnormal findings: Secondary | ICD-10-CM

## 2024-02-02 DIAGNOSIS — R7303 Prediabetes: Secondary | ICD-10-CM | POA: Diagnosis not present

## 2024-02-02 MED ORDER — MELOXICAM 15 MG PO TABS
15.0000 mg | ORAL_TABLET | Freq: Every day | ORAL | 2 refills | Status: DC
Start: 2024-02-02 — End: 2024-06-10
  Filled 2024-02-02: qty 30, 30d supply, fill #0
  Filled 2024-05-30: qty 30, 30d supply, fill #1

## 2024-02-02 MED ORDER — CYCLOBENZAPRINE HCL 10 MG PO TABS
10.0000 mg | ORAL_TABLET | Freq: Three times a day (TID) | ORAL | 2 refills | Status: DC | PRN
  Filled 2024-02-02: qty 90, 30d supply, fill #0
  Filled 2024-05-30: qty 90, 30d supply, fill #1

## 2024-02-02 MED ORDER — HYDROCODONE-ACETAMINOPHEN 5-325 MG PO TABS
1.0000 | ORAL_TABLET | Freq: Two times a day (BID) | ORAL | 0 refills | Status: DC | PRN
  Filled 2024-02-02: qty 60, 30d supply, fill #0

## 2024-02-02 NOTE — Progress Notes (Signed)
 Established Patient Office Visit  Subjective:  Patient ID: Geoffrey Henderson, male    DOB: April 05, 1971  Age: 53 y.o. MRN: [id number]  Chief Complaint  Patient presents with   Follow-up    1 mo follow up    No new complaints, here for lab review and medication refills. Neck pain has improved but still c/o right shoulder pain.     No other concerns at this time.   No past medical history on file.  Past Surgical History:  Procedure Laterality Date   HERNIA REPAIR     LAPAROSCOPIC APPENDECTOMY N/A 02/01/2022   Procedure: APPENDECTOMY LAPAROSCOPIC;  Surgeon: Adalberto Acton, MD;  Location: [facility];  Service: General;  Laterality: N/A;    Social History   Socioeconomic History   Marital status: Single    Spouse name: Not on file   Number of children: Not on file   Years of education: Not on file   Highest education level: Not on file  Occupational History   Not on file  Tobacco Use   Smoking status: Former    Current packs/day: 0.00    Types: Cigarettes    Quit date: 06/06/2011    Years since quitting: 12.6   Smokeless tobacco: Not on file  Substance and Sexual Activity   Alcohol use: Yes    Comment: occasional   Drug use: Not Currently    Comment: hookah   Sexual activity: Yes    Birth control/protection: Condom  Other Topics Concern   Not on file  Social History Narrative   Not on file   Social Drivers of Health   Financial Resource Strain: Not on file  Food Insecurity: Not on file  Transportation Needs: Not on file  Physical Activity: Not on file  Stress: Not on file  Social Connections: Not on file  Intimate Partner Violence: Not on file    No family history on file.  No Known Allergies  Outpatient Medications Prior to Visit  Medication Sig   acetaminophen  (TYLENOL ) 500 MG tablet Take 2 tablets (1,000 mg total) by mouth every 8 (eight) hours as needed.   atorvastatin  (LIPITOR) 10 MG tablet Take 1 tablet (10 mg total) by mouth daily.    cholecalciferol  (VITAMIN D3) 25 MCG (1000 UNIT) tablet Take 1 tablet (1,000 Units total) by mouth daily.   fluticasone (FLONASE) 50 MCG/ACT nasal spray Place 2 sprays into both nostrils daily as needed for allergies.   LUMIGAN 0.01 % SOLN Place 1 drop into both eyes at bedtime.   montelukast (SINGULAIR) 10 MG tablet TAKE 1 TABLET BY MOUTH EVERY MORNING   polyethylene glycol (MIRALAX  / GLYCOLAX ) 17 g packet Take 17 g by mouth daily as needed.   ZYRTEC ALLERGY 10 MG tablet Take 10 mg by mouth daily as needed for allergies or rhinitis.   [DISCONTINUED] oxyCODONE  (OXY IR/ROXICODONE ) 5 MG immediate release tablet Take 1 tablet (5 mg total) by mouth every 6 (six) hours as needed for breakthrough pain. (Patient not taking: Reported on 02/02/2024)   No facility-administered medications prior to visit.    Review of Systems  Constitutional: Negative.   HENT: Negative.    Eyes:  Positive for blurred vision.  Respiratory: Negative.    Cardiovascular: Negative.   Gastrointestinal: Negative.   Genitourinary: Negative.   Musculoskeletal:  Positive for joint pain, myalgias and neck pain.  Skin: Negative.   Neurological: Negative.   Endo/Heme/Allergies: Negative.        Objective:   BP 122/70  Pulse 67   Temp (!) 97.1 F (36.2 C)   Ht 5\' 7"  (1.702 m)   Wt 208 lb 3.2 oz (94.4 kg)   SpO2 96%   BMI 32.61 kg/m   Vitals:   02/02/24 0924  BP: 122/70  Pulse: 67  Temp: (!) 97.1 F (36.2 C)  Height: 5\' 7"  (1.702 m)  Weight: 208 lb 3.2 oz (94.4 kg)  SpO2: 96%  BMI (Calculated): 32.6    Physical Exam Vitals reviewed.  Constitutional:      Appearance: Normal appearance.  HENT:     Head: Normocephalic.     Left Ear: There is no impacted cerumen.     Nose: Nose normal.     Mouth/Throat:     Mouth: Mucous membranes are moist.     Pharynx: No posterior oropharyngeal erythema.  Eyes:     Extraocular Movements: Extraocular movements intact.     Pupils: Pupils are equal, round, and  reactive to light.  Cardiovascular:     Rate and Rhythm: Regular rhythm.     Chest Wall: PMI is not displaced.     Pulses: Normal pulses.     Heart sounds: Normal heart sounds. No murmur heard. Pulmonary:     Effort: Pulmonary effort is normal.     Breath sounds: Normal air entry. No rhonchi or rales.  Abdominal:     General: Abdomen is flat. Bowel sounds are normal. There is no distension.     Palpations: Abdomen is soft. There is no hepatomegaly, splenomegaly or mass.     Tenderness: There is no abdominal tenderness.  Musculoskeletal:        General: Normal range of motion.     Cervical back: Normal range of motion and neck supple. Spasms (r cervical extending to trapezius) present.     Thoracic back: Spasms present.     Right lower leg: No edema.     Left lower leg: No edema.  Skin:    General: Skin is warm and dry.  Neurological:     General: No focal deficit present.     Mental Status: He is alert and oriented to person, place, and time.     Cranial Nerves: No cranial nerve deficit.     Motor: No weakness.  Psychiatric:        Mood and Affect: Mood normal.        Behavior: Behavior normal.      No results found for any visits on 02/02/24.  Recent Results (from the past 2160 hours)  Hemoglobin A1c     Status: Abnormal   Collection Time: 01/02/24  8:53 AM  Result Value Ref Range   Hgb A1c MFr Bld 5.9 (H) 4.8 - 5.6 %    Comment:          Prediabetes: 5.7 - 6.4          Diabetes: >6.4          Glycemic control for adults with diabetes: <7.0    Est. average glucose Bld gHb Est-mCnc 123 mg/dL  TSH     Status: None   Collection Time: 01/02/24  8:53 AM  Result Value Ref Range   TSH 2.550 0.450 - 4.500 uIU/mL  CMP14+EGFR     Status: Abnormal   Collection Time: 01/02/24  8:53 AM  Result Value Ref Range   Glucose 108 (H) 70 - 99 mg/dL   BUN 9 6 - 24 mg/dL   Creatinine, Ser 1.61 0.76 - 1.27 mg/dL  eGFR 77 >59 mL/min/1.73   BUN/Creatinine Ratio 8 (L) 9 - 20   Sodium  141 134 - 144 mmol/L   Potassium 4.2 3.5 - 5.2 mmol/L   Chloride 104 96 - 106 mmol/L   CO2 23 20 - 29 mmol/L   Calcium  9.4 8.7 - 10.2 mg/dL   Total Protein 6.9 6.0 - 8.5 g/dL   Albumin 4.1 3.8 - 4.9 g/dL   Globulin, Total 2.8 1.5 - 4.5 g/dL   Bilirubin Total 0.3 0.0 - 1.2 mg/dL   Alkaline Phosphatase 36 (L) 44 - 121 IU/L   AST 18 0 - 40 IU/L   ALT 18 0 - 44 IU/L  Lipid panel     Status: Abnormal   Collection Time: 01/02/24  8:53 AM  Result Value Ref Range   Cholesterol, Total 181 100 - 199 mg/dL   Triglycerides 65 0 - 149 mg/dL   HDL 52 >84 mg/dL   VLDL Cholesterol Cal 12 5 - 40 mg/dL   LDL Chol Calc (NIH) 696 (H) 0 - 99 mg/dL   Chol/HDL Ratio 3.5 0.0 - 5.0 ratio    Comment:                                   T. Chol/HDL Ratio                                             Men  Women                               1/2 Avg.Risk  3.4    3.3                                   Avg.Risk  5.0    4.4                                2X Avg.Risk  9.6    7.1                                3X Avg.Risk 23.4   11.0   POCT Glucose (CBG)     Status: Abnormal   Collection Time: 01/02/24  9:33 AM  Result Value Ref Range   POC Glucose 105 (A) 70 - 99 mg/dl      Assessment & Plan:  As per problem list  Problem List Items Addressed This Visit       Musculoskeletal and Integument   Spasm of right trapezius muscle   Relevant Medications   cyclobenzaprine  (FLEXERIL ) 10 MG tablet   Lateral epicondylitis of left elbow   Relevant Medications   meloxicam  (MOBIC ) 15 MG tablet   Trochanteric bursitis of right hip - Primary   Relevant Medications   HYDROcodone -acetaminophen  (NORCO/VICODIN) 5-325 MG tablet     Other   Mixed hyperlipidemia    No follow-ups on file.   Total time spent: 20 minutes  Arzella Bitters, MD  02/02/2024   This document may have been prepared by [facility] Voice Recognition software and as such may  include unintentional dictation errors.

## 2024-02-27 ENCOUNTER — Other Ambulatory Visit (HOSPITAL_COMMUNITY): Payer: Self-pay

## 2024-03-12 ENCOUNTER — Other Ambulatory Visit (HOSPITAL_COMMUNITY): Payer: Self-pay

## 2024-03-12 ENCOUNTER — Other Ambulatory Visit: Payer: Self-pay

## 2024-03-12 ENCOUNTER — Encounter: Payer: Self-pay | Admitting: Internal Medicine

## 2024-03-12 ENCOUNTER — Ambulatory Visit: Admitting: Internal Medicine

## 2024-03-12 ENCOUNTER — Encounter (HOSPITAL_COMMUNITY): Payer: Self-pay | Admitting: Pharmacist

## 2024-03-12 VITALS — BP 117/79 | HR 89 | Temp 98.5°F | Ht 67.0 in | Wt 214.0 lb

## 2024-03-12 DIAGNOSIS — R7303 Prediabetes: Secondary | ICD-10-CM | POA: Diagnosis not present

## 2024-03-12 DIAGNOSIS — Z0130 Encounter for examination of blood pressure without abnormal findings: Secondary | ICD-10-CM

## 2024-03-12 DIAGNOSIS — B86 Scabies: Secondary | ICD-10-CM

## 2024-03-12 LAB — POCT CBG (FASTING - GLUCOSE)-MANUAL ENTRY: Glucose Fasting, POC: 138 mg/dL — AB (ref 70–99)

## 2024-03-12 MED ORDER — TRIAMCINOLONE ACETONIDE 0.5 % EX OINT
1.0000 | TOPICAL_OINTMENT | Freq: Two times a day (BID) | CUTANEOUS | 0 refills | Status: DC
  Filled 2024-03-12: qty 30, 15d supply, fill #0

## 2024-03-12 MED ORDER — IVERMECTIN 6 MG PO TABS
18.0000 mg | ORAL_TABLET | Freq: Once | ORAL | 0 refills | Status: AC
  Filled 2024-03-12: qty 3, 1d supply, fill #0

## 2024-03-12 MED ORDER — PERMETHRIN 1 % EX LIQD
CUTANEOUS | 1 refills | Status: DC
  Filled 2024-03-12: qty 60, fill #0

## 2024-03-12 NOTE — Progress Notes (Signed)
 Established Patient Office Visit  Subjective:  Patient ID: Geoffrey Henderson, male    DOB: 1971/02/17  Age: 53 y.o. MRN: [id number]  Chief Complaint  Patient presents with   Acute Visit    Itching started last weekend. Rash is on his arms and trunk.     C/o generalized rash and itching of his lower torso, back and both arms. He works as an [employer].    No other concerns at this time.   No past medical history on file.  Past Surgical History:  Procedure Laterality Date   HERNIA REPAIR     LAPAROSCOPIC APPENDECTOMY N/A 02/01/2022   Procedure: APPENDECTOMY LAPAROSCOPIC;  Surgeon: Signe Mitzie LABOR, MD;  Location: [facility];  Service: General;  Laterality: N/A;    Social History   Socioeconomic History   Marital status: Single    Spouse name: Not on file   Number of children: Not on file   Years of education: Not on file   Highest education level: Not on file  Occupational History   Not on file  Tobacco Use   Smoking status: Former    Current packs/day: 0.00    Types: Cigarettes    Quit date: 06/06/2011    Years since quitting: 12.7   Smokeless tobacco: Not on file  Substance and Sexual Activity   Alcohol use: Yes    Comment: occasional   Drug use: Not Currently    Comment: hookah   Sexual activity: Yes    Birth control/protection: Condom  Other Topics Concern   Not on file  Social History Narrative   Not on file   Social Drivers of Health   Financial Resource Strain: Not on file  Food Insecurity: Not on file  Transportation Needs: Not on file  Physical Activity: Not on file  Stress: Not on file  Social Connections: Not on file  Intimate Partner Violence: Not on file    No family history on file.  No Known Allergies  Outpatient Medications Prior to Visit  Medication Sig   acetaminophen  (TYLENOL ) 500 MG tablet Take 2 tablets (1,000 mg total) by mouth every 8 (eight) hours as needed.   atorvastatin  (LIPITOR) 10 MG tablet Take 1 tablet  (10 mg total) by mouth daily.   cholecalciferol  (VITAMIN D3) 25 MCG (1000 UNIT) tablet Take 1 tablet (1,000 Units total) by mouth daily.   cyclobenzaprine  (FLEXERIL ) 10 MG tablet Take 1 tablet (10 mg total) by mouth 3 (three) times daily as needed for muscle spasms.   fluticasone (FLONASE) 50 MCG/ACT nasal spray Place 2 sprays into both nostrils daily as needed for allergies.   HYDROcodone -acetaminophen  (NORCO/VICODIN) 5-325 MG tablet Take 1 tablet by mouth 2 (two) times daily as needed for moderate pain (pain score 4-6).   LUMIGAN 0.01 % SOLN Place 1 drop into both eyes at bedtime.   meloxicam  (MOBIC ) 15 MG tablet Take 1 tablet (15 mg total) by mouth daily.   montelukast (SINGULAIR) 10 MG tablet TAKE 1 TABLET BY MOUTH EVERY MORNING   polyethylene glycol (MIRALAX  / GLYCOLAX ) 17 g packet Take 17 g by mouth daily as needed.   ZYRTEC ALLERGY 10 MG tablet Take 10 mg by mouth daily as needed for allergies or rhinitis.   No facility-administered medications prior to visit.    Review of Systems  All other systems reviewed and are negative.      Objective:   BP 117/79   Pulse 89   Temp 98.5 F (36.9 C)  Ht 5' 7 (1.702 m)   Wt 214 lb (97.1 kg)   SpO2 97%   BMI 33.52 kg/m   Vitals:   03/12/24 1256  BP: 117/79  Pulse: 89  Temp: 98.5 F (36.9 C)  Height: 5' 7 (1.702 m)  Weight: 214 lb (97.1 kg)  SpO2: 97%  BMI (Calculated): 33.51    Physical Exam Vitals reviewed.  Constitutional:      Appearance: Normal appearance.  HENT:     Head: Normocephalic.     Left Ear: There is no impacted cerumen.     Nose: Nose normal.     Mouth/Throat:     Mouth: Mucous membranes are moist.     Pharynx: No posterior oropharyngeal erythema.  Eyes:     Extraocular Movements: Extraocular movements intact.     Pupils: Pupils are equal, round, and reactive to light.  Cardiovascular:     Rate and Rhythm: Regular rhythm.     Chest Wall: PMI is not displaced.     Pulses: Normal pulses.      Heart sounds: Normal heart sounds. No murmur heard. Pulmonary:     Effort: Pulmonary effort is normal.     Breath sounds: Normal air entry. No rhonchi or rales.  Abdominal:     General: Abdomen is flat. Bowel sounds are normal. There is no distension.     Palpations: Abdomen is soft. There is no hepatomegaly, splenomegaly or mass.     Tenderness: There is no abdominal tenderness.  Musculoskeletal:        General: Normal range of motion.     Cervical back: Normal range of motion and neck supple. Spasms (r cervical extending to trapezius) present.     Thoracic back: Spasms present.     Right lower leg: No edema.     Left lower leg: No edema.  Skin:    General: Skin is warm and dry.     Findings: Rash present. Rash is papular (on trunk and both arms).  Neurological:     General: No focal deficit present.     Mental Status: He is alert and oriented to person, place, and time.     Cranial Nerves: No cranial nerve deficit.     Motor: No weakness.  Psychiatric:        Mood and Affect: Mood normal.        Behavior: Behavior normal.      Results for orders placed or performed in visit on 03/12/24  POCT CBG (Fasting - Glucose)  Result Value Ref Range   Glucose Fasting, POC 138 (A) 70 - 99 mg/dL        Assessment & Plan:  As per problem list. Problem List Items Addressed This Visit       Other   Prediabetes - Primary   Relevant Orders   POCT CBG (Fasting - Glucose) (Completed)   Other Visit Diagnoses       Scabies       Relevant Medications   Ivermectin  6 MG TABS   triamcinolone  ointment (KENALOG ) 0.5 %   permethrin  (NIX) 1 % external liquid       Return if symptoms worsen or fail to improve.   Total time spent: 30 minutes  Sherrill Cinderella Perry, MD  03/12/2024   This document may have been prepared by [facility] Voice Recognition software and as such may include unintentional dictation errors.

## 2024-03-13 ENCOUNTER — Other Ambulatory Visit (HOSPITAL_COMMUNITY): Payer: Self-pay

## 2024-03-13 ENCOUNTER — Other Ambulatory Visit: Payer: Self-pay

## 2024-03-14 ENCOUNTER — Telehealth: Payer: Self-pay

## 2024-03-14 ENCOUNTER — Other Ambulatory Visit (HOSPITAL_COMMUNITY): Payer: Self-pay

## 2024-03-14 NOTE — Telephone Encounter (Signed)
 Patient called stating that his work ask him to take the rest of the week off and he's needing another  Drs note for today and tomorrow, can you print and sign one for the patient

## 2024-03-15 NOTE — Telephone Encounter (Signed)
 Note sent to patient & pt informed

## 2024-04-09 ENCOUNTER — Other Ambulatory Visit (HOSPITAL_COMMUNITY): Payer: Self-pay

## 2024-04-09 MED ORDER — VYZULTA 0.024 % OP SOLN
1.0000 [drp] | Freq: Every evening | OPHTHALMIC | 3 refills | Status: AC
  Filled 2024-04-09: qty 10, 90d supply, fill #0
  Filled 2024-08-25: qty 5, 50d supply, fill #0

## 2024-05-19 ENCOUNTER — Other Ambulatory Visit: Payer: Self-pay | Admitting: Internal Medicine

## 2024-05-30 ENCOUNTER — Other Ambulatory Visit: Payer: Self-pay | Admitting: Internal Medicine

## 2024-05-30 ENCOUNTER — Other Ambulatory Visit (HOSPITAL_COMMUNITY): Payer: Self-pay

## 2024-05-30 DIAGNOSIS — E782 Mixed hyperlipidemia: Secondary | ICD-10-CM

## 2024-05-30 DIAGNOSIS — M7061 Trochanteric bursitis, right hip: Secondary | ICD-10-CM

## 2024-05-31 ENCOUNTER — Other Ambulatory Visit (HOSPITAL_COMMUNITY): Payer: Self-pay

## 2024-05-31 MED ORDER — HYDROCODONE-ACETAMINOPHEN 5-325 MG PO TABS
1.0000 | ORAL_TABLET | Freq: Two times a day (BID) | ORAL | 0 refills | Status: AC | PRN
  Filled 2024-05-31: qty 60, 30d supply, fill #0

## 2024-06-07 ENCOUNTER — Other Ambulatory Visit

## 2024-06-08 LAB — LIPID PANEL
Chol/HDL Ratio: 3.9 ratio (ref 0.0–5.0)
Cholesterol, Total: 172 mg/dL (ref 100–199)
HDL: 44 mg/dL (ref >39)
LDL Chol Calc (NIH): 113 mg/dL — ABNORMAL HIGH (ref 0–99)
Triglycerides: 78 mg/dL (ref 0–149)
VLDL Cholesterol Cal: 15 mg/dL (ref 5–40)

## 2024-06-08 LAB — COMPREHENSIVE METABOLIC PANEL WITH GFR
ALT: 22 IU/L (ref 0–44)
AST: 23 IU/L (ref 0–40)
Albumin: 4.5 g/dL (ref 3.8–4.9)
Alkaline Phosphatase: 39 IU/L — ABNORMAL LOW (ref 47–123)
BUN/Creatinine Ratio: 11 (ref 9–20)
BUN: 13 mg/dL (ref 6–24)
Bilirubin Total: 0.4 mg/dL (ref 0.0–1.2)
CO2: 22 mmol/L (ref 20–29)
Calcium: 9.4 mg/dL (ref 8.7–10.2)
Chloride: 102 mmol/L (ref 96–106)
Creatinine, Ser: 1.19 mg/dL (ref 0.76–1.27)
Globulin, Total: 2.6 g/dL (ref 1.5–4.5)
Glucose: 102 mg/dL — ABNORMAL HIGH (ref 70–99)
Potassium: 4.2 mmol/L (ref 3.5–5.2)
Sodium: 139 mmol/L (ref 134–144)
Total Protein: 7.1 g/dL (ref 6.0–8.5)
eGFR: 73 mL/min/1.73 (ref >59)

## 2024-06-08 LAB — CBC WITH DIFF/PLATELET
Basophils Absolute: 0.1 x10E3/uL (ref 0.0–0.2)
Basos: 1 %
EOS (ABSOLUTE): 0.2 x10E3/uL (ref 0.0–0.4)
Eos: 4 %
Hematocrit: 44.5 % (ref 37.5–51.0)
Hemoglobin: 15.1 g/dL (ref 13.0–17.7)
Immature Grans (Abs): 0 x10E3/uL (ref 0.0–0.1)
Immature Granulocytes: 0 %
Lymphocytes Absolute: 2.3 x10E3/uL (ref 0.7–3.1)
Lymphs: 43 %
MCH: 32 pg (ref 26.6–33.0)
MCHC: 33.9 g/dL (ref 31.5–35.7)
MCV: 94 fL (ref 79–97)
Monocytes Absolute: 0.6 x10E3/uL (ref 0.1–0.9)
Monocytes: 11 %
Neutrophils Absolute: 2.3 x10E3/uL (ref 1.4–7.0)
Neutrophils: 41 %
Platelets: 261 x10E3/uL (ref 150–450)
RBC: 4.72 x10E6/uL (ref 4.14–5.80)
RDW: 12.4 % (ref 11.6–15.4)
WBC: 5.5 x10E3/uL (ref 3.4–10.8)

## 2024-06-08 LAB — HEMOGLOBIN A1C
Est. average glucose Bld gHb Est-mCnc: 120 mg/dL
Hgb A1c MFr Bld: 5.8 % — ABNORMAL HIGH (ref 4.8–5.6)

## 2024-06-08 LAB — PSA: Prostate Specific Ag, Serum: 1.7 ng/mL (ref 0.0–4.0)

## 2024-06-10 ENCOUNTER — Encounter: Payer: Self-pay | Admitting: Internal Medicine

## 2024-06-10 ENCOUNTER — Ambulatory Visit (INDEPENDENT_AMBULATORY_CARE_PROVIDER_SITE_OTHER): Admitting: Internal Medicine

## 2024-06-10 ENCOUNTER — Other Ambulatory Visit (HOSPITAL_COMMUNITY): Payer: Self-pay

## 2024-06-10 ENCOUNTER — Ambulatory Visit: Payer: Self-pay | Admitting: Internal Medicine

## 2024-06-10 VITALS — BP 130/82 | HR 78 | Temp 98.0°F | Ht 67.0 in | Wt 215.0 lb

## 2024-06-10 DIAGNOSIS — Z1331 Encounter for screening for depression: Secondary | ICD-10-CM | POA: Diagnosis not present

## 2024-06-10 DIAGNOSIS — E782 Mixed hyperlipidemia: Secondary | ICD-10-CM | POA: Diagnosis not present

## 2024-06-10 DIAGNOSIS — M7712 Lateral epicondylitis, left elbow: Secondary | ICD-10-CM | POA: Diagnosis not present

## 2024-06-10 DIAGNOSIS — M62830 Muscle spasm of back: Secondary | ICD-10-CM

## 2024-06-10 DIAGNOSIS — Z0001 Encounter for general adult medical examination with abnormal findings: Secondary | ICD-10-CM | POA: Diagnosis not present

## 2024-06-10 DIAGNOSIS — Z0130 Encounter for examination of blood pressure without abnormal findings: Secondary | ICD-10-CM

## 2024-06-10 DIAGNOSIS — R7303 Prediabetes: Secondary | ICD-10-CM

## 2024-06-10 DIAGNOSIS — M7061 Trochanteric bursitis, right hip: Secondary | ICD-10-CM

## 2024-06-10 MED ORDER — ATORVASTATIN CALCIUM 20 MG PO TABS
20.0000 mg | ORAL_TABLET | Freq: Every day | ORAL | 11 refills | Status: AC
  Filled 2024-06-10: qty 30, 30d supply, fill #0
  Filled 2024-08-25: qty 30, 30d supply, fill #1
  Filled 2024-09-28: qty 30, 30d supply, fill #2

## 2024-06-10 MED ORDER — CYCLOBENZAPRINE HCL 10 MG PO TABS
10.0000 mg | ORAL_TABLET | Freq: Three times a day (TID) | ORAL | 2 refills | Status: DC | PRN
  Filled 2024-06-10 – 2024-08-25 (×2): qty 90, 30d supply, fill #0

## 2024-06-10 MED ORDER — CELECOXIB 200 MG PO CAPS
200.0000 mg | ORAL_CAPSULE | Freq: Two times a day (BID) | ORAL | 1 refills | Status: AC
  Filled 2024-06-10: qty 180, 90d supply, fill #0

## 2024-06-10 MED ORDER — MELOXICAM 15 MG PO TABS
15.0000 mg | ORAL_TABLET | Freq: Every day | ORAL | 2 refills | Status: AC
  Filled 2024-06-10: qty 30, 30d supply, fill #0

## 2024-06-10 NOTE — Addendum Note (Signed)
 Addended by: BRYCE MARKER AHMAD on: 06/10/2024 03:33 PM   Modules accepted: Orders

## 2024-06-10 NOTE — Progress Notes (Signed)
 Established Patient Office Visit  Subjective:  Patient ID: Geoffrey Henderson, male    DOB: [birth year]  Age: 53 y.o. MRN: [id number]  Chief Complaint  Patient presents with   Annual Exam    Physical, 5 month CPE lab results    Here for CPE, lab review and medication refills. No new complaints, lab reviewed     No other concerns at this time.   No past medical history on file.  Past Surgical History:  Procedure Laterality Date   HERNIA REPAIR     LAPAROSCOPIC APPENDECTOMY N/A 02/01/2022   Procedure: APPENDECTOMY LAPAROSCOPIC;  Surgeon: Signe Mitzie LABOR, MD;  Location: [facility];  Service: General;  Laterality: N/A;    Social History   Socioeconomic History   Marital status: Single    Spouse name: Not on file   Number of children: Not on file   Years of education: Not on file   Highest education level: Not on file  Occupational History   Not on file  Tobacco Use   Smoking status: Former    Current packs/day: 0.00    Types: Cigarettes    Quit date: 06/06/2011    Years since quitting: 13.0   Smokeless tobacco: Not on file  Substance and Sexual Activity   Alcohol use: Yes    Comment: occasional   Drug use: Not Currently    Comment: hookah   Sexual activity: Yes    Birth control/protection: Condom  Other Topics Concern   Not on file  Social History Narrative   Not on file   Social Drivers of Health   Financial Resource Strain: Not on file  Food Insecurity: Not on file  Transportation Needs: Not on file  Physical Activity: Not on file  Stress: Not on file  Social Connections: Not on file  Intimate Partner Violence: Not on file    No family history on file.  No Known Allergies  Outpatient Medications Prior to Visit  Medication Sig   acetaminophen  (TYLENOL ) 500 MG tablet Take 2 tablets (1,000 mg total) by mouth every 8 (eight) hours as needed.   atorvastatin  (LIPITOR) 10 MG tablet Take 1 tablet (10 mg total) by mouth daily.   celecoxib (CELEBREX) 200 MG  capsule TAKE 2 CAPSULES BY MOUTH DAILY AS DIRECTED AS NEEDED   cholecalciferol  (VITAMIN D3) 25 MCG (1000 UNIT) tablet Take 1 tablet (1,000 Units total) by mouth daily.   cyclobenzaprine  (FLEXERIL ) 10 MG tablet Take 1 tablet (10 mg total) by mouth 3 (three) times daily as needed for muscle spasms.   fluticasone (FLONASE) 50 MCG/ACT nasal spray Place 2 sprays into both nostrils daily as needed for allergies.   HYDROcodone -acetaminophen  (NORCO/VICODIN) 5-325 MG tablet Take 1 tablet by mouth 2 (two) times daily as needed for moderate pain (pain score 4-6).   Latanoprostene Bunod  (VYZULTA ) 0.024 % SOLN Place 1 drop into both eyes nightly as directed   LUMIGAN 0.01 % SOLN Place 1 drop into both eyes at bedtime.   meloxicam  (MOBIC ) 15 MG tablet Take 1 tablet (15 mg total) by mouth daily.   montelukast (SINGULAIR) 10 MG tablet TAKE 1 TABLET BY MOUTH EVERY MORNING   triamcinolone  ointment (KENALOG ) 0.5 % Apply 1 Application topically 2 (two) times daily.   ZYRTEC ALLERGY 10 MG tablet Take 10 mg by mouth daily as needed for allergies or rhinitis.   permethrin  (NIX) 1 % external liquid Apply to affected area once (Patient not taking: Reported on 06/10/2024)   polyethylene glycol (MIRALAX  /  GLYCOLAX ) 17 g packet Take 17 g by mouth daily as needed. (Patient not taking: Reported on 06/10/2024)   No facility-administered medications prior to visit.    Review of Systems  Constitutional: Negative.   HENT: Negative.    Respiratory: Negative.    Cardiovascular: Negative.   Gastrointestinal: Negative.   Genitourinary: Negative.   Musculoskeletal:  Positive for joint pain, myalgias and neck pain.  Skin: Negative.   Neurological: Negative.   Endo/Heme/Allergies: Negative.        Objective:   BP 130/82   Pulse 78   Temp 98 F (36.7 C)   Ht 5' 7 (1.702 m)   Wt 215 lb (97.5 kg)   SpO2 98%   BMI 33.67 kg/m   Vitals:   06/10/24 1406  BP: 130/82  Pulse: 78  Temp: 98 F (36.7 C)  Height: 5' 7  (1.702 m)  Weight: 215 lb (97.5 kg)  SpO2: 98%  BMI (Calculated): 33.67    Physical Exam Vitals reviewed.  Constitutional:      Appearance: Normal appearance.  HENT:     Head: Normocephalic.     Left Ear: There is no impacted cerumen.     Nose: Nose normal.     Mouth/Throat:     Mouth: Mucous membranes are moist.     Pharynx: No posterior oropharyngeal erythema.  Eyes:     Extraocular Movements: Extraocular movements intact.     Pupils: Pupils are equal, round, and reactive to light.  Cardiovascular:     Rate and Rhythm: Regular rhythm.     Chest Wall: PMI is not displaced.     Pulses: Normal pulses.     Heart sounds: Normal heart sounds. No murmur heard. Pulmonary:     Effort: Pulmonary effort is normal.     Breath sounds: Normal air entry. No rhonchi or rales.  Abdominal:     General: Abdomen is flat. Bowel sounds are normal. There is no distension.     Palpations: Abdomen is soft. There is no hepatomegaly, splenomegaly or mass.     Tenderness: There is no abdominal tenderness.  Musculoskeletal:        General: Normal range of motion.     Cervical back: Normal range of motion and neck supple. Spasms (r cervical extending to trapezius) present.     Thoracic back: Spasms present.     Right lower leg: No edema.     Left lower leg: No edema.  Skin:    General: Skin is warm and dry.     Findings: Rash present. Rash is papular (on trunk and both arms).  Neurological:     General: No focal deficit present.     Mental Status: He is alert and oriented to person, place, and time.     Cranial Nerves: No cranial nerve deficit.     Motor: No weakness.  Psychiatric:        Mood and Affect: Mood normal.        Behavior: Behavior normal.      No results found for any visits on 06/10/24.  Recent Results (from the past 2160 hours)  CBC With Diff/Platelet     Status: None   Collection Time: 06/07/24  8:15 AM  Result Value Ref Range   WBC 5.5 3.4 - 10.8 x10E3/uL   RBC 4.72  4.14 - 5.80 x10E6/uL   Hemoglobin 15.1 13.0 - 17.7 g/dL   Hematocrit 55.4 62.4 - 51.0 %   MCV 94 79 - 97 fL  MCH 32.0 26.6 - 33.0 pg   MCHC 33.9 31.5 - 35.7 g/dL   RDW 87.5 88.3 - 84.5 %   Platelets 261 150 - 450 x10E3/uL   Neutrophils 41 Not Estab. %   Lymphs 43 Not Estab. %   Monocytes 11 Not Estab. %   Eos 4 Not Estab. %   Basos 1 Not Estab. %   Neutrophils Absolute 2.3 1.4 - 7.0 x10E3/uL   Lymphocytes Absolute 2.3 0.7 - 3.1 x10E3/uL   Monocytes Absolute 0.6 0.1 - 0.9 x10E3/uL   EOS (ABSOLUTE) 0.2 0.0 - 0.4 x10E3/uL   Basophils Absolute 0.1 0.0 - 0.2 x10E3/uL   Immature Granulocytes 0 Not Estab. %   Immature Grans (Abs) 0.0 0.0 - 0.1 x10E3/uL  Comprehensive metabolic panel with GFR     Status: Abnormal   Collection Time: 06/07/24  8:15 AM  Result Value Ref Range   Glucose 102 (H) 70 - 99 mg/dL   BUN 13 6 - 24 mg/dL   Creatinine, Ser 8.80 0.76 - 1.27 mg/dL   eGFR 73 >40 fO/fpw/8.26   BUN/Creatinine Ratio 11 9 - 20   Sodium 139 134 - 144 mmol/L   Potassium 4.2 3.5 - 5.2 mmol/L   Chloride 102 96 - 106 mmol/L   CO2 22 20 - 29 mmol/L   Calcium  9.4 8.7 - 10.2 mg/dL   Total Protein 7.1 6.0 - 8.5 g/dL   Albumin 4.5 3.8 - 4.9 g/dL   Globulin, Total 2.6 1.5 - 4.5 g/dL   Bilirubin Total 0.4 0.0 - 1.2 mg/dL   Alkaline Phosphatase 39 (L) 47 - 123 IU/L   AST 23 0 - 40 IU/L   ALT 22 0 - 44 IU/L  Lipid panel     Status: Abnormal   Collection Time: 06/07/24  8:15 AM  Result Value Ref Range   Cholesterol, Total 172 100 - 199 mg/dL   Triglycerides 78 0 - 149 mg/dL   HDL 44 >60 mg/dL   VLDL Cholesterol Cal 15 5 - 40 mg/dL   LDL Chol Calc (NIH) 886 (H) 0 - 99 mg/dL   Chol/HDL Ratio 3.9 0.0 - 5.0 ratio    Comment:                                   T. Chol/HDL Ratio                                             Men  Women                               1/2 Avg.Risk  3.4    3.3                                   Avg.Risk  5.0    4.4                                2X Avg.Risk  9.6     7.1  3X Avg.Risk 23.4   11.0   PSA     Status: None   Collection Time: 06/07/24  8:15 AM  Result Value Ref Range   Prostate Specific Ag, Serum 1.7 0.0 - 4.0 ng/mL    Comment: Roche ECLIA methodology. According to the American Urological Association, Serum PSA should decrease and remain at undetectable levels after radical prostatectomy. The AUA defines biochemical recurrence as an initial PSA value 0.2 ng/mL or greater followed by a subsequent confirmatory PSA value 0.2 ng/mL or greater. Values obtained with different assay methods or kits cannot be used interchangeably. Results cannot be interpreted as absolute evidence of the presence or absence of malignant disease.   Hemoglobin A1c     Status: Abnormal   Collection Time: 06/07/24  8:15 AM  Result Value Ref Range   Hgb A1c MFr Bld 5.8 (H) 4.8 - 5.6 %    Comment:          Prediabetes: 5.7 - 6.4          Diabetes: >6.4          Glycemic control for adults with diabetes: <7.0    Est. average glucose Bld gHb Est-mCnc 120 mg/dL      Assessment & Plan:   Problem List Items Addressed This Visit   None   No follow-ups on file.   Total time spent: 40 minutes  Sherrill Cinderella Perry, MD  06/10/2024   This document may have been prepared by [facility] Voice Recognition software and as such may include unintentional dictation errors.

## 2024-06-11 ENCOUNTER — Other Ambulatory Visit (HOSPITAL_COMMUNITY): Payer: Self-pay

## 2024-06-19 LAB — DRUG SCREEN 13 W/CONF , SERUM
Amphetamines, IA: NEGATIVE ng/mL
Barbiturates, IA: NEGATIVE ug/mL
Benzodiazepines, IA: NEGATIVE ng/mL
Cocaine & Metabolite, IA: NEGATIVE ng/mL
FENTANYL, IA: NEGATIVE ng/mL
MEPERIDINE, IA: NEGATIVE ng/mL
Methadone, IA: NEGATIVE ng/mL
Opiates, IA: NEGATIVE ng/mL
Oxycodones, IA: NEGATIVE ng/mL
Phencyclidine, IA: NEGATIVE ng/mL
Propoxyphene, IA: NEGATIVE ng/mL
THC(Marijuana) Metabolite, IA: POSITIVE ng/mL — AB
TRAMADOL, IA: NEGATIVE ng/mL

## 2024-06-19 LAB — THC,MS,WB/SP RFX
Cannabidiol: NEGATIVE
Cannabinoid Confirmation: POSITIVE
Carboxy-THC: 14 ng/mL
Hydroxy-THC: NEGATIVE ng/mL
Tetrahydrocannabinol(THC): 1.1 ng/mL

## 2024-06-24 ENCOUNTER — Ambulatory Visit: Payer: Self-pay

## 2024-07-12 ENCOUNTER — Encounter: Admitting: Internal Medicine

## 2024-07-22 ENCOUNTER — Other Ambulatory Visit (HOSPITAL_COMMUNITY): Payer: Self-pay

## 2024-07-22 ENCOUNTER — Ambulatory Visit (INDEPENDENT_AMBULATORY_CARE_PROVIDER_SITE_OTHER): Admitting: Internal Medicine

## 2024-07-22 VITALS — BP 136/88 | HR 86 | Temp 99.0°F | Ht 67.0 in | Wt 210.0 lb

## 2024-07-22 DIAGNOSIS — Z131 Encounter for screening for diabetes mellitus: Secondary | ICD-10-CM

## 2024-07-22 DIAGNOSIS — M5416 Radiculopathy, lumbar region: Secondary | ICD-10-CM | POA: Insufficient documentation

## 2024-07-22 DIAGNOSIS — Z0130 Encounter for examination of blood pressure without abnormal findings: Secondary | ICD-10-CM

## 2024-07-22 DIAGNOSIS — G8929 Other chronic pain: Secondary | ICD-10-CM

## 2024-07-22 DIAGNOSIS — B009 Herpesviral infection, unspecified: Secondary | ICD-10-CM | POA: Insufficient documentation

## 2024-07-22 DIAGNOSIS — M5126 Other intervertebral disc displacement, lumbar region: Secondary | ICD-10-CM | POA: Insufficient documentation

## 2024-07-22 MED ORDER — GABAPENTIN 300 MG PO CAPS
300.0000 mg | ORAL_CAPSULE | Freq: Three times a day (TID) | ORAL | 2 refills | Status: AC
  Filled 2024-07-22: qty 90, 30d supply, fill #0
  Filled 2024-08-25: qty 90, 30d supply, fill #1
  Filled 2024-09-28: qty 90, 30d supply, fill #2

## 2024-07-22 MED ORDER — METHYLPREDNISOLONE 4 MG PO TBPK
ORAL_TABLET | ORAL | 0 refills | Status: AC
  Filled 2024-07-22: qty 21, 6d supply, fill #0

## 2024-07-22 MED ORDER — ACYCLOVIR 200 MG PO CAPS
200.0000 mg | ORAL_CAPSULE | Freq: Every day | ORAL | 0 refills | Status: AC
  Filled 2024-07-22: qty 25, 5d supply, fill #0

## 2024-07-22 NOTE — Progress Notes (Signed)
**Note Geoffrey Henderson-Identified via Obfuscation**  Established Patient Office Visit  Subjective:  Patient ID: Geoffrey Henderson, male    DOB: 1971/03/17  Age: 53 y.o. MRN: [id number]  Chief Complaint  Patient presents with   Acute Visit    Pain in groin on right leg radiating down the leg and and towards the lower back    C/o acute onset of lower back pain and hip pain that radiates to the front and back of his thigh. Initially started after lifting a heavy box. Exacerbated by valsalva maneuvers. Unrelieved by current analgesia     No other concerns at this time.   No past medical history on file.  Past Surgical History:  Procedure Laterality Date   HERNIA REPAIR     LAPAROSCOPIC APPENDECTOMY N/A 02/01/2022   Procedure: APPENDECTOMY LAPAROSCOPIC;  Surgeon: Signe Mitzie LABOR, MD;  Location: [facility];  Service: General;  Laterality: N/A;    Social History   Socioeconomic History   Marital status: Single    Spouse name: Not on file   Number of children: Not on file   Years of education: Not on file   Highest education level: Not on file  Occupational History   Not on file  Tobacco Use   Smoking status: Former    Current packs/day: 0.00    Types: Cigarettes    Quit date: 06/06/2011    Years since quitting: 13.1   Smokeless tobacco: Not on file  Substance and Sexual Activity   Alcohol use: Yes    Comment: occasional   Drug use: Not Currently    Comment: hookah   Sexual activity: Yes    Birth control/protection: Condom  Other Topics Concern   Not on file  Social History Narrative   Not on file   Social Drivers of Health   Financial Resource Strain: Not on file  Food Insecurity: Not on file  Transportation Needs: Not on file  Physical Activity: Not on file  Stress: Not on file  Social Connections: Not on file  Intimate Partner Violence: Not on file    No family history on file.  No Known Allergies  Outpatient Medications Prior to Visit  Medication Sig   acetaminophen  (TYLENOL ) 500 MG tablet Take 2 tablets  (1,000 mg total) by mouth every 8 (eight) hours as needed.   atorvastatin  (LIPITOR) 20 MG tablet Take 1 tablet (20 mg total) by mouth daily.   celecoxib (CELEBREX) 200 MG capsule Take 1 capsule (200 mg total) by mouth 2 (two) times daily. TAKE 2 CAPSULES BY MOUTH DAILY AS DIRECTED AS NEEDED   cholecalciferol  (VITAMIN D3) 25 MCG (1000 UNIT) tablet Take 1 tablet (1,000 Units total) by mouth daily.   cyclobenzaprine  (FLEXERIL ) 10 MG tablet Take 1 tablet (10 mg total) by mouth 3 (three) times daily as needed for muscle spasms.   fluticasone (FLONASE) 50 MCG/ACT nasal spray Place 2 sprays into both nostrils daily as needed for allergies.   HYDROcodone -acetaminophen  (NORCO/VICODIN) 5-325 MG tablet Take 1 tablet by mouth 2 (two) times daily as needed for moderate pain (pain score 4-6).   Latanoprostene Bunod  (VYZULTA ) 0.024 % SOLN Place 1 drop into both eyes nightly as directed   LUMIGAN 0.01 % SOLN Place 1 drop into both eyes at bedtime.   meloxicam  (MOBIC ) 15 MG tablet Take 1 tablet (15 mg total) by mouth daily.   montelukast (SINGULAIR) 10 MG tablet TAKE 1 TABLET BY MOUTH EVERY MORNING   ZYRTEC ALLERGY 10 MG tablet Take 10 mg by mouth daily as needed  for allergies or rhinitis.   [DISCONTINUED] permethrin  (NIX) 1 % external liquid Apply to affected area once (Patient not taking: Reported on 07/22/2024)   [DISCONTINUED] polyethylene glycol (MIRALAX  / GLYCOLAX ) 17 g packet Take 17 g by mouth daily as needed. (Patient not taking: Reported on 07/22/2024)   [DISCONTINUED] triamcinolone  ointment (KENALOG ) 0.5 % Apply 1 Application topically 2 (two) times daily. (Patient not taking: Reported on 07/22/2024)   No facility-administered medications prior to visit.    ROS     Objective:   BP 136/88   Pulse 86   Temp 99 F (37.2 C)   Ht 5' 7 (1.702 m)   Wt 210 lb (95.3 kg)   SpO2 97%   BMI 32.89 kg/m   Vitals:   07/22/24 1500  BP: 136/88  Pulse: 86  Temp: 99 F (37.2 C)  Height: 5' 7 (1.702  m)  Weight: 210 lb (95.3 kg)  SpO2: 97%  BMI (Calculated): 32.88    Physical Exam Vitals reviewed.  Constitutional:      Appearance: Normal appearance.  HENT:     Head: Normocephalic.     Left Ear: There is no impacted cerumen.     Nose: Nose normal.     Mouth/Throat:     Mouth: Mucous membranes are moist.     Pharynx: No posterior oropharyngeal erythema.  Eyes:     Extraocular Movements: Extraocular movements intact.     Pupils: Pupils are equal, round, and reactive to light.  Cardiovascular:     Rate and Rhythm: Regular rhythm.     Chest Wall: PMI is not displaced.     Pulses: Normal pulses.     Heart sounds: Normal heart sounds. No murmur heard. Pulmonary:     Effort: Pulmonary effort is normal.     Breath sounds: Normal air entry. No rhonchi or rales.  Abdominal:     General: Abdomen is flat. Bowel sounds are normal. There is no distension.     Palpations: Abdomen is soft. There is no hepatomegaly, splenomegaly or mass.     Tenderness: There is no abdominal tenderness.  Musculoskeletal:     Cervical back: Normal range of motion and neck supple. Spasms (r cervical extending to trapezius) present.     Thoracic back: Spasms present.     Lumbar back: Spasms present. Positive right straight leg raise test.     Right lower leg: No edema.     Left lower leg: No edema.  Skin:    General: Skin is warm and dry.     Findings: Rash present. Rash is papular (on trunk and both arms).  Neurological:     General: No focal deficit present.     Mental Status: Geoffrey Henderson is alert and oriented to person, place, and time.     Cranial Nerves: No cranial nerve deficit.     Motor: No weakness.  Psychiatric:        Mood and Affect: Mood normal.        Behavior: Behavior normal.      No results found for any visits on 07/22/24.      Assessment & Plan:  Geoffrey Henderson was seen today for acute visit.  Herniated lumbar intervertebral disc -     methylPREDNISolone; Use as directed  Dispense: 21  tablet; Refill: 0 -     Gabapentin; Take 1 capsule (300 mg total) by mouth 3 (three) times daily.  Dispense: 90 capsule; Refill: 2  Lumbar radiculopathy, acute  HSV-1 infection -  Acyclovir; Take 1 capsule (200 mg total) by mouth 5 (five) times daily for 5 days.  Dispense: 25 capsule; Refill: 0  Other chronic pain -     Drug Screen 13 with reflex Confirmation (AMP,BAR,BZO,COC,PCP,THC,OPI,OXY,MD,FEN,MEP,PPX,TRAM), Serum    Problem List Items Addressed This Visit       Nervous and Auditory   Lumbar radiculopathy, acute   Relevant Medications   gabapentin (NEURONTIN) 300 MG capsule     Musculoskeletal and Integument   Herniated lumbar intervertebral disc - Primary   Relevant Medications   methylPREDNISolone (MEDROL DOSEPAK) 4 MG TBPK tablet   gabapentin (NEURONTIN) 300 MG capsule     Other   HSV-1 infection   Relevant Medications   acyclovir (ZOVIRAX) 200 MG capsule   Other Visit Diagnoses       Other chronic pain       Relevant Medications   methylPREDNISolone (MEDROL DOSEPAK) 4 MG TBPK tablet   gabapentin (NEURONTIN) 300 MG capsule   Other Relevant Orders   Drug Screen 13 with reflex Confirmation (AMP,BAR,BZO,COC,PCP,THC,OPI,OXY,MD,FEN,MEP,PPX,TRAM), Serum     Geoffrey Henderson was seen today for acute visit.  Herniated lumbar intervertebral disc -     methylPREDNISolone; Use as directed  Dispense: 21 tablet; Refill: 0 -     Gabapentin; Take 1 capsule (300 mg total) by mouth 3 (three) times daily.  Dispense: 90 capsule; Refill: 2  Lumbar radiculopathy, acute  HSV-1 infection -     Acyclovir; Take 1 capsule (200 mg total) by mouth 5 (five) times daily for 5 days.  Dispense: 25 capsule; Refill: 0  Other chronic pain -     Drug Screen 13 with reflex Confirmation (AMP,BAR,BZO,COC,PCP,THC,OPI,OXY,MD,FEN,MEP,PPX,TRAM), Serum     Return if symptoms worsen or fail to improve.   Total time spent: 30 minutes. This time includes review of previous notes and results and  patient face to face interaction during today'Geoffrey Henderson visit.    Sherrill Cinderella Perry, MD  07/22/2024   This document may have been prepared by [facility] Voice Recognition software and as such may include unintentional dictation errors.

## 2024-08-25 ENCOUNTER — Other Ambulatory Visit: Payer: Self-pay | Admitting: Internal Medicine

## 2024-08-25 ENCOUNTER — Other Ambulatory Visit (HOSPITAL_COMMUNITY): Payer: Self-pay

## 2024-08-25 DIAGNOSIS — M7061 Trochanteric bursitis, right hip: Secondary | ICD-10-CM

## 2024-08-26 ENCOUNTER — Other Ambulatory Visit (HOSPITAL_COMMUNITY): Payer: Self-pay

## 2024-08-26 ENCOUNTER — Other Ambulatory Visit: Payer: Self-pay

## 2024-08-26 NOTE — Telephone Encounter (Signed)
 Left a VM

## 2024-08-27 ENCOUNTER — Other Ambulatory Visit (HOSPITAL_COMMUNITY): Payer: Self-pay

## 2024-08-28 ENCOUNTER — Other Ambulatory Visit

## 2024-08-31 LAB — DRUG SCREEN 13 W/CONF , SERUM
Amphetamines, IA: NEGATIVE ng/mL
Barbiturates, IA: NEGATIVE ug/mL
Benzodiazepines, IA: NEGATIVE ng/mL
Cocaine & Metabolite, IA: NEGATIVE ng/mL
FENTANYL, IA: NEGATIVE ng/mL
MEPERIDINE, IA: NEGATIVE ng/mL
Methadone, IA: NEGATIVE ng/mL
Opiates, IA: NEGATIVE ng/mL
Oxycodones, IA: NEGATIVE ng/mL
Phencyclidine, IA: NEGATIVE ng/mL
Propoxyphene, IA: NEGATIVE ng/mL
THC(Marijuana) Metabolite, IA: NEGATIVE ng/mL
TRAMADOL, IA: NEGATIVE ng/mL

## 2024-09-03 ENCOUNTER — Ambulatory Visit: Payer: Self-pay | Admitting: Internal Medicine

## 2024-09-10 ENCOUNTER — Ambulatory Visit: Admitting: Internal Medicine

## 2024-09-10 NOTE — Progress Notes (Signed)
 Pt.notified

## 2024-09-17 ENCOUNTER — Other Ambulatory Visit (HOSPITAL_COMMUNITY): Payer: Self-pay

## 2024-09-18 ENCOUNTER — Other Ambulatory Visit (HOSPITAL_COMMUNITY): Payer: Self-pay

## 2024-09-18 ENCOUNTER — Ambulatory Visit (INDEPENDENT_AMBULATORY_CARE_PROVIDER_SITE_OTHER): Admitting: Internal Medicine

## 2024-09-18 ENCOUNTER — Other Ambulatory Visit: Payer: Self-pay

## 2024-09-18 VITALS — BP 130/72 | HR 88 | Ht 67.0 in | Wt 209.0 lb

## 2024-09-18 DIAGNOSIS — M62830 Muscle spasm of back: Secondary | ICD-10-CM | POA: Diagnosis not present

## 2024-09-18 DIAGNOSIS — R7303 Prediabetes: Secondary | ICD-10-CM

## 2024-09-18 DIAGNOSIS — G8929 Other chronic pain: Secondary | ICD-10-CM

## 2024-09-18 DIAGNOSIS — Z0130 Encounter for examination of blood pressure without abnormal findings: Secondary | ICD-10-CM

## 2024-09-18 DIAGNOSIS — E782 Mixed hyperlipidemia: Secondary | ICD-10-CM | POA: Diagnosis not present

## 2024-09-18 MED ORDER — CYCLOBENZAPRINE HCL 10 MG PO TABS
10.0000 mg | ORAL_TABLET | Freq: Three times a day (TID) | ORAL | 2 refills | Status: DC | PRN
  Filled 2024-09-18: qty 90, 30d supply, fill #0

## 2024-09-18 NOTE — Progress Notes (Signed)
 "  Established Patient Office Visit  Subjective:  Patient ID: Geoffrey Henderson, male    DOB: 07-25-1971  Age: 54 y.o. MRN: [id number]  Chief Complaint  Patient presents with   Follow-up    3 month follow up    No new complaints, here for lab review and medication refills. Attended substance abuse counseling and awaits certificate of completion.    No other concerns at this time.   No past medical history on file.  Past Surgical History:  Procedure Laterality Date   HERNIA REPAIR     LAPAROSCOPIC APPENDECTOMY N/A 02/01/2022   Procedure: APPENDECTOMY LAPAROSCOPIC;  Surgeon: Signe Mitzie LABOR, MD;  Location: [facility];  Service: General;  Laterality: N/A;    Social History   Socioeconomic History   Marital status: Single    Spouse name: Not on file   Number of children: Not on file   Years of education: Not on file   Highest education level: Not on file  Occupational History   Not on file  Tobacco Use   Smoking status: Former    Current packs/day: 0.00    Types: Cigarettes    Quit date: 06/06/2011    Years since quitting: 13.2   Smokeless tobacco: Not on file  Substance and Sexual Activity   Alcohol use: Yes    Comment: occasional   Drug use: Not Currently    Comment: hookah   Sexual activity: Yes    Birth control/protection: Condom  Other Topics Concern   Not on file  Social History Narrative   Not on file   Social Drivers of Health   Tobacco Use: Medium Risk (06/10/2024)   Patient History    Smoking Tobacco Use: Former    Smokeless Tobacco Use: Unknown    Passive Exposure: Not on Actuary Strain: Not on file  Food Insecurity: Not on file  Transportation Needs: Not on file  Physical Activity: Not on file  Stress: Not on file  Social Connections: Not on file  Intimate Partner Violence: Not on file  Depression (PHQ2-9): Medium Risk (06/10/2024)   Depression (PHQ2-9)    PHQ-2 Score: 5  Alcohol Screen: Not on file  Housing: Not on file   Utilities: Not on file  Health Literacy: Not on file    No family history on file.  Allergies[1]  Show/hide medication list[2]  Review of Systems  Constitutional:  Positive for weight loss (5 lbs).  HENT: Negative.    Respiratory: Negative.    Cardiovascular: Negative.   Gastrointestinal: Negative.   Genitourinary: Negative.   Musculoskeletal:  Positive for joint pain, myalgias and neck pain.  Skin: Negative.   Neurological: Negative.   Endo/Heme/Allergies: Negative.        Objective:   BP 130/72   Pulse 88   Ht 5' 7 (1.702 m)   Wt 209 lb (94.8 kg)   SpO2 96%   BMI 32.73 kg/m   Vitals:   09/18/24 0933  BP: 130/72  Pulse: 88  Height: 5' 7 (1.702 m)  Weight: 209 lb (94.8 kg)  SpO2: 96%  BMI (Calculated): 32.73    Physical Exam Vitals reviewed.  Constitutional:      Appearance: Normal appearance.  HENT:     Head: Normocephalic.     Left Ear: There is no impacted cerumen.     Nose: Nose normal.     Mouth/Throat:     Mouth: Mucous membranes are moist.     Pharynx: No posterior oropharyngeal erythema.  Eyes:     Extraocular Movements: Extraocular movements intact.     Pupils: Pupils are equal, round, and reactive to light.  Cardiovascular:     Rate and Rhythm: Regular rhythm.     Chest Wall: PMI is not displaced.     Pulses: Normal pulses.     Heart sounds: Normal heart sounds. No murmur heard. Pulmonary:     Effort: Pulmonary effort is normal.     Breath sounds: Normal air entry. No rhonchi or rales.  Abdominal:     General: Abdomen is flat. Bowel sounds are normal. There is no distension.     Palpations: Abdomen is soft. There is no hepatomegaly, splenomegaly or mass.     Tenderness: There is no abdominal tenderness.  Musculoskeletal:     Cervical back: Normal range of motion and neck supple. Spasms (r cervical extending to trapezius) present.     Thoracic back: Spasms present.     Lumbar back: Spasms present. Positive right straight leg raise  test.     Right lower leg: No edema.     Left lower leg: No edema.  Skin:    General: Skin is warm and dry.     Findings: Rash present. Rash is papular (on trunk and both arms).  Neurological:     General: No focal deficit present.     Mental Status: Geoffrey Henderson is alert and oriented to person, place, and time.     Cranial Nerves: No cranial nerve deficit.     Motor: No weakness.  Psychiatric:        Mood and Affect: Mood normal.        Behavior: Behavior normal.      No results found for any visits on 09/18/24.  Recent Results (from the past 2160 hours)  Drug Screen 13 with reflex Confirmation (AMP,BAR,BZO,COC,PCP,THC,OPI,OXY,MD,FEN,MEP,PPX,TRAM), Serum     Status: None   Collection Time: 08/28/24  8:29 AM  Result Value Ref Range   Amphetamines, IA Negative Cutoff:50 ng/mL   Barbiturates, IA Negative Cutoff:0.1 ug/mL   Benzodiazepines, IA Negative Cutoff:20 ng/mL   Cocaine & Metabolite, IA Negative Cutoff:25 ng/mL   Phencyclidine, IA Negative Cutoff:8 ng/mL   THC(Marijuana) Metabolite, IA Negative Cutoff:5 ng/mL   Opiates, IA Negative Cutoff:5 ng/mL   Oxycodones, IA Negative Cutoff:5 ng/mL   Methadone, IA Negative Cutoff:25 ng/mL   FENTANYL , IA Negative Cutoff:1.0 ng/mL   Propoxyphene, IA Negative Cutoff:50 ng/mL   MEPERIDINE , IA Negative Cutoff:100 ng/mL   TRAMADOL , IA Negative Cutoff:50 ng/mL    Comment: This test was developed and its performance characteristics determined by [facility].  It has not been cleared or approved by the Food and Drug Administration.       Assessment & Plan:  Geoffrey Henderson was seen today for follow-up.  Other chronic pain  Spasm of right trapezius muscle  Prediabetes  Mixed hyperlipidemia    Problem List Items Addressed This Visit       Musculoskeletal and Integument   Spasm of right trapezius muscle     Other   Prediabetes   Mixed hyperlipidemia   Other Visit Diagnoses       Other chronic pain    -  Primary       Return in about 3  weeks (around 10/09/2024) for Pain Management.   Total time spent: 20 minutes. This time includes review of previous notes and results and patient face to face interaction during today'Davine Sweney visit.    Sherrill Cinderella Perry, MD  09/18/2024   This  document may have been prepared by [employer] Voice Recognition software and as such may include unintentional dictation errors.     [1] No Known Allergies [2]  Outpatient Medications Prior to Visit  Medication Sig   acetaminophen  (TYLENOL ) 500 MG tablet Take 2 tablets (1,000 mg total) by mouth every 8 (eight) hours as needed.   atorvastatin  (LIPITOR) 20 MG tablet Take 1 tablet (20 mg total) by mouth daily.   celecoxib  (CELEBREX ) 200 MG capsule Take 1 capsule (200 mg total) by mouth 2 (two) times daily. TAKE 2 CAPSULES BY MOUTH DAILY AS DIRECTED AS NEEDED   cyclobenzaprine  (FLEXERIL ) 10 MG tablet Take 1 tablet (10 mg total) by mouth 3 (three) times daily as needed for muscle spasms.   fluticasone (FLONASE) 50 MCG/ACT nasal spray Place 2 sprays into both nostrils daily as needed for allergies.   gabapentin  (NEURONTIN ) 300 MG capsule Take 1 capsule (300 mg total) by mouth 3 (three) times daily.   HYDROcodone -acetaminophen  (NORCO/VICODIN) 5-325 MG tablet Take 1 tablet by mouth 2 (two) times daily as needed for moderate pain (pain score 4-6).   Latanoprostene Bunod  (VYZULTA ) 0.024 % SOLN Place 1 drop into both eyes nightly as directed   LUMIGAN 0.01 % SOLN Place 1 drop into both eyes at bedtime.   methylPREDNISolone  (MEDROL  DOSEPAK) 4 MG TBPK tablet Use as directed   montelukast (SINGULAIR) 10 MG tablet TAKE 1 TABLET BY MOUTH EVERY MORNING   ZYRTEC ALLERGY 10 MG tablet Take 10 mg by mouth daily as needed for allergies or rhinitis.   No facility-administered medications prior to visit.   "

## 2024-09-21 ENCOUNTER — Other Ambulatory Visit: Payer: Self-pay | Admitting: Internal Medicine

## 2024-09-21 DIAGNOSIS — M62830 Muscle spasm of back: Secondary | ICD-10-CM

## 2024-09-22 ENCOUNTER — Other Ambulatory Visit: Payer: Self-pay | Admitting: Internal Medicine

## 2024-09-23 ENCOUNTER — Other Ambulatory Visit (HOSPITAL_COMMUNITY): Payer: Self-pay

## 2024-09-27 ENCOUNTER — Other Ambulatory Visit: Payer: Self-pay

## 2024-09-27 DIAGNOSIS — G8929 Other chronic pain: Secondary | ICD-10-CM

## 2024-09-28 ENCOUNTER — Other Ambulatory Visit (HOSPITAL_COMMUNITY): Payer: Self-pay

## 2024-10-10 ENCOUNTER — Telehealth: Payer: Self-pay

## 2024-10-10 NOTE — Telephone Encounter (Signed)
 Patient called this morning and said he needed to r/s his pain management appt this was his first one, he said he can't leave work to come bc he is working in [employer], pt was advised since this was his first PM appt id have to clear it with you first before rescheduling

## 2024-10-11 ENCOUNTER — Encounter: Admitting: Internal Medicine
